# Patient Record
Sex: Female | Born: 1950 | Race: White | Hispanic: No | Marital: Married | Smoking: Former smoker
Health system: Southern US, Academic
[De-identification: ages and names within clinical notes are randomized; demographics above are authoritative.]

## PROBLEM LIST (undated history)

## (undated) DIAGNOSIS — J449 Chronic obstructive pulmonary disease, unspecified: Secondary | ICD-10-CM

## (undated) DIAGNOSIS — E039 Hypothyroidism, unspecified: Secondary | ICD-10-CM

## (undated) DIAGNOSIS — F319 Bipolar disorder, unspecified: Secondary | ICD-10-CM

## (undated) DIAGNOSIS — I10 Essential (primary) hypertension: Secondary | ICD-10-CM

## (undated) DIAGNOSIS — E785 Hyperlipidemia, unspecified: Secondary | ICD-10-CM

## (undated) DIAGNOSIS — M47816 Spondylosis without myelopathy or radiculopathy, lumbar region: Secondary | ICD-10-CM

## (undated) DIAGNOSIS — Z972 Presence of dental prosthetic device (complete) (partial): Secondary | ICD-10-CM

## (undated) DIAGNOSIS — F411 Generalized anxiety disorder: Secondary | ICD-10-CM

## (undated) DIAGNOSIS — E119 Type 2 diabetes mellitus without complications: Secondary | ICD-10-CM

## (undated) HISTORY — PX: HX BACK SURGERY: SHX140

## (undated) HISTORY — PX: HX GALL BLADDER SURGERY/CHOLE: SHX55

## (undated) HISTORY — PX: HX HYSTERECTOMY: SHX81

## (undated) HISTORY — DX: Complete loss of teeth, unspecified cause, unspecified class: Z97.2

## (undated) HISTORY — DX: Chronic obstructive pulmonary disease, unspecified (CMS HCC): J44.9

## (undated) HISTORY — DX: Presence of dental prosthetic device (complete) (partial): Z97.2

## (undated) HISTORY — DX: Spondylosis without myelopathy or radiculopathy, lumbar region: M47.816

## (undated) HISTORY — DX: Chronic obstructive pulmonary disease, unspecified: J44.9

---

## 2006-04-16 ENCOUNTER — Ambulatory Visit (HOSPITAL_COMMUNITY): Payer: Self-pay | Admitting: EXTERNAL

## 2010-10-13 HISTORY — PX: HX BACK SURGERY: SHX140

## 2022-12-26 ENCOUNTER — Inpatient Hospital Stay
Admission: RE | Admit: 2022-12-26 | Discharge: 2022-12-31 | DRG: 885 | Disposition: A | Discharge status: Home / Self Care | Payer: Medicare PPO | Attending: Psychiatry | Admitting: Psychiatry

## 2022-12-26 ENCOUNTER — Emergency Department (HOSPITAL_COMMUNITY): Payer: Medicare PPO

## 2022-12-26 ENCOUNTER — Encounter (HOSPITAL_COMMUNITY): Payer: Self-pay

## 2022-12-26 ENCOUNTER — Other Ambulatory Visit: Payer: Self-pay

## 2022-12-26 DIAGNOSIS — Z79890 Hormone replacement therapy (postmenopausal): Secondary | ICD-10-CM

## 2022-12-26 DIAGNOSIS — H409 Unspecified glaucoma: Secondary | ICD-10-CM | POA: Diagnosis present

## 2022-12-26 DIAGNOSIS — E039 Hypothyroidism, unspecified: Secondary | ICD-10-CM | POA: Diagnosis present

## 2022-12-26 DIAGNOSIS — Z7982 Long term (current) use of aspirin: Secondary | ICD-10-CM

## 2022-12-26 DIAGNOSIS — F17210 Nicotine dependence, cigarettes, uncomplicated: Secondary | ICD-10-CM | POA: Diagnosis present

## 2022-12-26 DIAGNOSIS — Z23 Encounter for immunization: Secondary | ICD-10-CM

## 2022-12-26 DIAGNOSIS — F411 Generalized anxiety disorder: Secondary | ICD-10-CM | POA: Diagnosis present

## 2022-12-26 DIAGNOSIS — Z79899 Other long term (current) drug therapy: Secondary | ICD-10-CM

## 2022-12-26 DIAGNOSIS — F32A Depression, unspecified: Principal | ICD-10-CM | POA: Diagnosis present

## 2022-12-26 DIAGNOSIS — E785 Hyperlipidemia, unspecified: Secondary | ICD-10-CM | POA: Diagnosis present

## 2022-12-26 DIAGNOSIS — F419 Anxiety disorder, unspecified: Secondary | ICD-10-CM

## 2022-12-26 DIAGNOSIS — Z811 Family history of alcohol abuse and dependence: Secondary | ICD-10-CM

## 2022-12-26 DIAGNOSIS — I10 Essential (primary) hypertension: Secondary | ICD-10-CM | POA: Diagnosis present

## 2022-12-26 DIAGNOSIS — T50901A Poisoning by unspecified drugs, medicaments and biological substances, accidental (unintentional), initial encounter: Secondary | ICD-10-CM

## 2022-12-26 DIAGNOSIS — Z639 Problem related to primary support group, unspecified: Secondary | ICD-10-CM

## 2022-12-26 DIAGNOSIS — Z1152 Encounter for screening for COVID-19: Secondary | ICD-10-CM

## 2022-12-26 DIAGNOSIS — E119 Type 2 diabetes mellitus without complications: Secondary | ICD-10-CM | POA: Diagnosis present

## 2022-12-26 DIAGNOSIS — F332 Major depressive disorder, recurrent severe without psychotic features: Principal | ICD-10-CM | POA: Diagnosis present

## 2022-12-26 DIAGNOSIS — Z7984 Long term (current) use of oral hypoglycemic drugs: Secondary | ICD-10-CM

## 2022-12-26 DIAGNOSIS — F319 Bipolar disorder, unspecified: Secondary | ICD-10-CM

## 2022-12-26 DIAGNOSIS — Z818 Family history of other mental and behavioral disorders: Secondary | ICD-10-CM

## 2022-12-26 DIAGNOSIS — F4320 Adjustment disorder, unspecified: Secondary | ICD-10-CM | POA: Diagnosis present

## 2022-12-26 DIAGNOSIS — R45851 Suicidal ideations: Secondary | ICD-10-CM | POA: Diagnosis present

## 2022-12-26 HISTORY — DX: Generalized anxiety disorder: F41.1

## 2022-12-26 HISTORY — DX: Hyperlipidemia, unspecified: E78.5

## 2022-12-26 HISTORY — DX: Type 2 diabetes mellitus without complications: E11.9

## 2022-12-26 HISTORY — DX: Essential (primary) hypertension: I10

## 2022-12-26 HISTORY — DX: Bipolar disorder, unspecified: F31.9

## 2022-12-26 HISTORY — DX: Bipolar disorder, unspecified (CMS HCC): F31.9

## 2022-12-26 HISTORY — DX: Hypothyroidism, unspecified: E03.9

## 2022-12-26 HISTORY — DX: Type 2 diabetes mellitus without complications (CMS HCC): E11.9

## 2022-12-26 LAB — COMPREHENSIVE METABOLIC PANEL, NON-FASTING
ALBUMIN: 3.2 g/dL — ABNORMAL LOW (ref 3.4–4.8)
ALKALINE PHOSPHATASE: 49 U/L — ABNORMAL LOW (ref 55–145)
ALT (SGPT): 11 U/L (ref 8–22)
ANION GAP: 9 mmol/L (ref 4–13)
AST (SGOT): 18 U/L (ref 8–45)
BILIRUBIN TOTAL: 0.3 mg/dL (ref 0.3–1.3)
BUN/CREA RATIO: 11 (ref 6–22)
BUN: 10 mg/dL (ref 8–25)
CALCIUM: 9.7 mg/dL (ref 8.6–10.3)
CHLORIDE: 107 mmol/L (ref 96–111)
CO2 TOTAL: 24 mmol/L (ref 23–31)
CREATININE: 0.91 mg/dL (ref 0.60–1.05)
ESTIMATED GFR - FEMALE: 68 mL/min/BSA (ref >=60)
GLUCOSE: 123 mg/dL (ref 65–125)
POTASSIUM: 3.7 mmol/L (ref 3.5–5.1)
PROTEIN TOTAL: 7.3 g/dL (ref 6.0–8.0)
SODIUM: 140 mmol/L (ref 136–145)

## 2022-12-26 LAB — CBC WITH DIFF
BASOPHIL #: 0.1 10*3/uL (ref 0.00–0.30)
BASOPHIL %: 1 % (ref 0–1)
EOSINOPHIL #: 0.1 10*3/uL (ref 0.00–0.50)
EOSINOPHIL %: 1 % (ref 0–4)
HCT: 38.6 % (ref 37.0–47.0)
HGB: 13.3 g/dL (ref 12.5–16.0)
LYMPHOCYTE #: 3.8 10*3/uL (ref 0.90–4.80)
LYMPHOCYTE %: 42 % — ABNORMAL HIGH (ref 23–35)
MCH: 28.5 pg (ref 28.0–33.0)
MCHC: 34.4 g/dL (ref 32.0–37.0)
MCV: 82.9 fL (ref 78.0–100.0)
MONOCYTE #: 0.9 10*3/uL (ref 0.30–0.90)
MONOCYTE %: 10 % (ref 0–12)
NEUTROPHIL #: 4 10*3/uL (ref 1.70–7.00)
NEUTROPHIL %: 45 % — ABNORMAL LOW (ref 50–70)
PLATELETS: 236 10*3/uL (ref 130–400)
RBC: 4.66 10*6/uL (ref 4.20–5.40)
RDW: 16.1 % (ref 9.9–16.5)
WBC: 9 10*3/uL (ref 4.5–11.0)

## 2022-12-26 LAB — DRUG SCREEN, NO CONFIRMATION, URINE
AMPHETAMINES, URINE: NEGATIVE
BARBITURATES URINE: NEGATIVE
BENZODIAZEPINES URINE: POSITIVE — AB
BUPRENORPHINE URINE: NEGATIVE
CANNABINOIDS URINE: NEGATIVE
COCAINE METABOLITES URINE: NEGATIVE
CREATININE RANDOM URINE: 63 mg/dL (ref 50–100)
ECSTASY/MDMA URINE: NEGATIVE
FENTANYL, RANDOM URINE: NEGATIVE
METHADONE URINE: NEGATIVE
OPIATES URINE (LOW CUTOFF): NEGATIVE
OXYCODONE URINE: NEGATIVE

## 2022-12-26 LAB — ACETAMINOPHEN LEVEL: ACETAMINOPHEN LEVEL: <5 ug/mL — ABNORMAL LOW (ref 10–30)

## 2022-12-26 LAB — COVID-19, FLU A/B, RSV RAPID BY PCR
INFLUENZA VIRUS TYPE A: NOT DETECTED
INFLUENZA VIRUS TYPE B: NOT DETECTED
RESPIRATORY SYNCTIAL VIRUS (RSV): NOT DETECTED
SARS-CoV-2: NOT DETECTED

## 2022-12-26 LAB — SALICYLATE ACID LEVEL: SALICYLATE LEVEL: <5 mg/dL — ABNORMAL LOW (ref 15–30)

## 2022-12-26 LAB — PT/INR
INR: 1.13 (ref 0.83–1.16)
PROTHROMBIN TIME: 13 seconds (ref 9.5–13.4)

## 2022-12-26 MED ORDER — FLU VACC 2023-24(65YR UP)-MF59C(PF) 60 MCG(15 MCGX4)/0.5 ML IM SYRINGE
0.5000 mL | INJECTION | INTRAMUSCULAR | Status: DC
Start: 2022-12-26 — End: 2022-12-29

## 2022-12-26 NOTE — ED Nurses Note (Signed)
security continues on watch

## 2022-12-26 NOTE — ED Provider Notes (Signed)
[name]  Aug 02, 1951  72 y.o.  female    Chief Complaint:   Chief Complaint   Patient presents with    Suicide Attempt    Drug Overdose Intentional   Reviewed    HPI: This is a 72 y.o. female who presents to the emergency department patient has been under treatment for suicide attempts in the past and has been on medications and shins has taken herself off them because side-effects and not able to tolerate them she was on gabapentin and Prozac they did not homes seemed to help her homes they caused her stomach upset so she stopped them homes she did wean herself with the Prozac down to 20 mg homes today he and she was feeling that she was just could not take it anymore feeling extremely depressed that she does not do anything right and according to her husband and others homes so she took 6 Xanax 1 mg tablets to 25 mg tablets some Benadryl and Norco homes thinking that that would probably did make her go to sleep and not wake up of hurt when she woke up she was A's A's she was holding a knife threatening to stab herself at her residence homes family brought her got a come here to the hospital for evaluation patient denies other complaints she was A's she does have history of chronic obstructive pulmonary disease diabetes and hypertension eyes homes extreme depression daughter states he has a history of bipolar disorder.  Patient did try and contact her psychiatrist to get an appointment and counselor but no one was able to see her and when she felt that there was no hope that has is no wanted to deal with her hers she decided to do what she did she was upset that did not work but now is she is here ears willing to get help and treatment          Past Medical History:   Past Medical History:   Diagnosis Date    Diabetes mellitus, type 2 (CMS HCC)     HTN (hypertension)    Bipolar disorder depression COPD    Past Surgical History:   Past Surgical History:   Procedure Laterality Date    Hx back surgery      Hx  cesarean section      Hx cholecystectomy      Hx hysterectomy     Reviewed    Social History:   Social History     Tobacco Use    Smoking status: Every Day     Types: Cigarettes     Passive exposure: Current    Smokeless tobacco: Never      Social History     Substance and Sexual Activity   Drug Use Not on file   Patient states he was stops smoking but now is started smoking again because of her depression    Allergies: No Known Allergies reviewed      Review of Systems: all systems reviewed and negative except as noted in HPI and perinent postivie and negatives in history of present illnes  Physical Exam    General:  Patient alert and oriented x4.  No acute distress.  BP (!) 196/86   Pulse 76   Temp 36.7 C (98.1 F)   Resp 17   Ht 1.626 m (5\' 4" )   Wt 90.7 kg (200 lb)   SpO2 97%   BMI 34.33 kg/m         HEENT:  Head:  Normocephalic, atraumatic.  Eyes: Normal conjunctiva.  Pupils round reactive light sclera clear ears members intact erythema nose is clear without discharge present throat clear without any X Oropharynx: Oral mucosa is moist.     Neck: Supple.  No lymphadenopathy.  Carotid pulses ,  thyroid ,  no tenderness,  range of motion good,      Respiratory:  Chest clear to auscultation bilaterally. No rales, rhonchi, or wheezes.  No respiratory distress.     Cardiovascular:  Heart regular rate and rhythm without murmurs, rubs, or gallops.      Abdomen: Soft, non-tender, bowel sounds positive no organomegally.      Extremities:  Normal pulses. No clubbing, cyanosis, or edema.  Good range of motion good tone good strength pulses are symmetrical    MS:  No ROM deficit.  Normal strength.     Neuro: Alert and oriented x4.  No focal motor or sensory deficits. [place] coma scale 15, gait ,  no ataxia,  cranial nerves 2 through 12 intact,   deep tendon 2 over4 strength 5/5 emotional crying at times    Skin:  Warm and dry. No rashes or lesions.  No petechiae        Medical Decision Making:   Patient was  triaged, vital signs were obtained, patient was  placed in a room.  I did examine the patient.  After examining the patient did time patient is probably passed the window of toxicity from homes Xanax Norco or Benadryl that she took 6 hours ago will speak with the poison control and check laboratories EKGs and placed her on a monitor to make sure he was no signs of toxicity or laboratory abnormalities does time physical examination shows her to be extremely depressed with no homes other significant symptoms blood pressure is elevated  Results for orders placed or performed during the hospital encounter of 12/26/22 (from the past 12 hour(s))   ACETAMINOPHEN LEVEL   Result Value Ref Range    ACETAMINOPHEN LEVEL  <5 (L) 10 - 30 ug/mL    COMPREHENSIVE METABOLIC PANEL, NON-FASTING   Result Value Ref Range    SODIUM 140 136 - 145 mmol/L    POTASSIUM 3.7 3.5 - 5.1 mmol/L    CHLORIDE 107 96 - 111 mmol/L    CO2 TOTAL 24 23 - 31 mmol/L    ANION GAP 9 4 - 13 mmol/L    BUN 10 8 - 25 mg/dL    CREATININE 0.91 0.60 - 1.05 mg/dL    BUN/CREA RATIO 11 6 - 22    ALBUMIN 3.2 (L) 3.4 - 4.8 g/dL     CALCIUM 9.7 8.6 - 10.3 mg/dL    GLUCOSE 123 65 - 125 mg/dL    ALKALINE PHOSPHATASE 49 (L) 55 - 145 U/L    ALT (SGPT) 11 8 - 22 U/L    AST (SGOT)  18 8 - 45 U/L    BILIRUBIN TOTAL 0.3 0.3 - 1.3 mg/dL    PROTEIN TOTAL 7.3 6.0 - 8.0 g/dL    ESTIMATED GFR - FEMALE 68 [id number] mL/min/BSA   SALICYLATE ACID LEVEL   Result Value Ref Range    SALICYLATE LEVEL <5 (L) 15 - 30 mg/dL   PT/INR   Result Value Ref Range    PROTHROMBIN TIME 13.0 9.5 - 13.4 seconds    INR 1.13 0.83 - 1.16   COVID-19, FLU A/B, RSV RAPID BY PCR   Result Value Ref Range    SARS-CoV-2 Not  Detected Not Detected    INFLUENZA VIRUS TYPE A Not Detected Not Detected    INFLUENZA VIRUS TYPE B Not Detected Not Detected    RESPIRATORY SYNCTIAL VIRUS (RSV) Not Detected Not Detected   CBC WITH DIFF   Result Value Ref Range    WBC 9.0 4.5 - 11.0 x10^3/uL    RBC 4.66 4.20 - 5.40 x10^6/uL    HGB  13.3 12.5 - 16.0 g/dL    HCT 38.6 37.0 - 47.0 %    MCV 82.9 78.0 - 100.0 fL    MCH 28.5 28.0 - 33.0 pg    MCHC 34.4 32.0 - 37.0 g/dL    RDW 16.1 9.9 - 16.5 %    PLATELETS 236 130 - 400 x10^3/uL    NEUTROPHIL % 45 (L) 50 - 70 %    LYMPHOCYTE % 42 (H) 23 - 35 %    MONOCYTE % 10 0 - 12 %    EOSINOPHIL % 1 0 - 4 %    BASOPHIL % 1 0 - 1 %    NEUTROPHIL # 4.00 1.70 - 7.00 x10^3/uL    LYMPHOCYTE # 3.80 0.90 - 4.80 x10^3/uL    MONOCYTE # 0.90 0.30 - 0.90 x10^3/uL    EOSINOPHIL # 0.10 0.00 - 0.50 x10^3/uL    BASOPHIL # 0.10 0.00 - 0.30 x10^3/uL            SAFE-T Protocol with C-SSRS ([place] Risk and Protective Factors) - Recent    Step 1: Identify Risk Factors                                                   C-SSRS Suicidal Ideation Severity Within one Month   Wish to be dead  Have you wished you were dead or wished you could go to sleep and not wake up?  Yes / No   Current suicidal thoughts  Have you actually had any thoughts of killing yourself?   Yes / No   Suicidal thoughts w/ Method (w/no specific Plan or Intent or act)  Have you been thinking about how you might do this? Yes / No   Suicidal Intent without Specific Plan  Have you had these thoughts and had some intention of acting on them? Yes / No   Intent with Plan  Have you started to work out or worked out the details of how to kill yourself? Do you intend to carry out this plan? Yes / No   C-SSRS Suicidal Behavior: "Have you ever done anything, started to do anything, or prepared to do anything to end your life?"    Examples: Collected pills, obtained a gun, gave away valuables, wrote a will or suicide note, took out pills but didn't swallow any, held a gun but changed your mind or it was grabbed from your hand, went to the roof but didn't jump; or actually took pills, tried to shoot yourself, cut yourself, tried to hang yourself, etc.  If "YES" Was it within the past 3 months? Lifetime    Yes / No    Past 3 Months    Yes / No   Activating Events may include:      Recent losses or other significant negative event(s) (legal, financial, relationship, etc.),   Pending incarceration or homelessness , Current or pending isolation or feeling alone  Treatment History may include:     Previous psychiatric diagnosis and treatments, Hopeless or dissatisfied with treatment , and Non-compliant with treatment      Clinical Status may include:     Hopelessness , Major Depressive Episode, Mixed affect episode (e.g. Bipolar), Command Hallucinations to hurt self, Chronic physical pain or other acute medical problem (e.g. CNS disorders), Highly Impulsive Behavior, Substance abuse or dependence, Agitation or severe anxiety, Perceived burden on family or others, Homicidal Ideation, Aggressive behavior towards others, Refuses or feels unable to agree to safety plan, Sexual abuse (lifetime), Family history of suicide, None       Access to lethal methods: Ask specifically about presence or absence of a firearm in the home or ease of accessing:    Yes / No     Step 2: Identify Protective Factors (Protective factors may not counteract significant acute suicide risk factors)   Internal:     Fear of death or dying due to pain and suffering and Identifies reasons for living External:     {Belief that suicide is immoral; high spirituality, Responsibility to family or others; living with family, Supportive social network of family or friends, and Engaged in work or school     Step 3: Specific questioning about Thoughts, Plans, and Suicidal Intent - (see Step 1 for Ideation Severity and Behavior)   C-SSRS Suicidal Ideation Intensity (with respect to the most severe ideation 1-5 identified above) Month   Frequency  How many times have you had these thoughts?  1: Less than once a week  2: Once per week  3: 2-5 times per week  4: Daily or almost daily  5: Many times each day   Duration  When you have the thoughts how long do they last? 1: Fleeting, less than a few seconds  2: Less than 1 hour/some of  the time  3: 1-4 hours/ a lot of the time  4: 4-8 hours/most of the day  5: More than 8 hours or persistent/continuous   Controllability  Could/can you stop thinking about killing yourself or wanting to die if you want to? 1: Easily able to control thoughts  2: Can control thoughts with little difficulty  3: Can control thoughts with some difficulty  4: Can control thoughts with a lot of difficulty  5: Unable to control thoughts  6: Does not attempt to control thoughts   Deterrents  Are there things - anyone or anything (e.g., family, religion, pain of death) - that stopped you from wanting to die or acting on thoughts of suicide? 1: Deterrents definitely stopped you from committing suicide  2: Deterrents probably stopped you  3: Uncertain that deterrents stopped you  4: Deterrents most likely did not stop you  5: Deterrents definitely did not stop you  0: Does not apply    Reasons for Ideation  What sort of reasons did you have for thinking about wanting to die or killing yourself?  Was it to end the pain or stop the way you were feeling (in other words you couldn't go on living with this pain or how you were feeling) or was it to get attention, revenge or a reaction from others? Or both? 1: Completely to get attention, revenge, or a reaction from others  2: Mostly to get attention, revenge, or a reaction from others  3: Equally to get attention, revenge, or a reaction from others and to stop the pain  4: mostly to end or stop the pain (  you couldn't go on living with the pain or how you were feeling)  5: completely to end or stop the pain (you couldn't go on living with the pain or how you were feeling)  0: Does not apply                               Step 4: Guidelines to Determine Level of Risk and Develop Interventions to LOWER Risk Level  "The estimation of suicide risk, at the culmination of the suicide assessment, is the quintessential clinical judgment, since no study has identified one specific risk factor  or set of risk factors as specifically predictive of suicide or other suicidal behavior."   From The American Psychiatric Association Practice Guidelines for the Assessment and Treatment of Patients with Suicidal Behaviors, page 24.   RISK STRATIFICATION TRIAGE   High Suicide Risk  Suicidal ideation with intent or intent with plan in past month (C-SSRS Suicidal Ideation #4 or #5)  Or  Suicidal behavior within past 3 months (C-SSRS Suicidal Behavior)   Initiate local psychiatric admission process  Stay with patient until transfer to higher level of care is complete  Follow-up and document outcome of emergency psychiatric evaluation     Moderate Suicide Risk  Suicidal ideation with method, WITHOUT plan, intent or behavior       in past month (C-SSRS Suicidal Ideation #3)  Or  Suicidal behavior more than 3 months ago (C-SSRS Suicidal Behavior Lifetime)  Or  Multiple risk factors and few protective factors Directly address suicide risk, implementing suicide prevention strategies  Develop Safety Plan     Low Suicide Risk  Wish to die or Suicidal Ideation WITHOUT method, intent, plan or behavior (C-SSRS Suicidal Ideation #1 or #2)   Or  Modifiable risk factors and strong protective factors  Or  No reported history of Suicidal Ideation or Behavior  Discretionary Outpatient Referral     Step 5: Documentation   Risk Level :    Based on the above scoring system and provider clinical judgment the patient is deemed severe suicide risk.          EKGs is sinus rhythm heart rate of 83 axis 29 no ST segment elevations or abnormalities present interpreted by me at 7:36 p.m.    Chest x-ray shows basilar atelectasis in his chronic lung changes reviewed by me pending Radiology report at 7:45 a.m.  Patient was medically stable hers no toxicity feel patient can be admitted to the psych unit mental health unit for behavioral health unit for further treatment care homes discussed with Dr. Higinio Plan he was going to admit her for depression  and suicide   This note was partially created using voice recognition software and is inherently subject to errors including those of syntax and "sound alike " substitutions which may escape proof reading.  In such instances, original meaning may be extrapolated by contextual derivation.         Clinical Impression:   Clinical Impression   Depression with suicidal ideation (Primary)   Intentional ingestion nontoxic  Medically stable for admission to [place] unit        Disposition: Admitted      Timmothy Euler, DO           Procedures

## 2022-12-26 NOTE — ED Nurses Note (Signed)
Called Security for patient watch 1:1

## 2022-12-26 NOTE — ED Nurses Note (Signed)
report called to [facility] RN [facility]

## 2022-12-26 NOTE — Care Plan (Signed)
The patient was cooperative with the admission process.  She was depressed and occasionally tearful.  She complained of feeling tired and wanting to go to bed.  She complained of chronic back pain which hampered her ambulation.    She denies HI and hallucinations.  She states that she has no impulse or urge to harm herself at this time.  She agrees to approach staff if she has suicidal ideations.    The patient went to bed and appeared to sleep through the night.      Problem: Adult Behavioral Health Plan of Care  Goal: Plan of Care Review  Outcome: Ongoing (see interventions/notes)

## 2022-12-26 NOTE — ED Nurses Note (Signed)
security continues as watch, daughter at bedside

## 2022-12-26 NOTE — Nurses Notes (Signed)
72 year old female admitted on a voluntary commitment through [facility]' ER.  "I don't want to live anymore, I just want to die."  The patient admits to intentionally overdosing on her medications in an attempt to end her life.  "I'm [age].  Life is over."  The patient took six mg xanax and 50 mg benadryl.  "I went to sleep."  "Then I woke up." "I failed."  "So I took a vicodan."  "I was going to take more, but I called my daughter instead."       She patient describes being "sad" for a very long time.  She complained of severe stress secondary to her recent move from [place].  "I told him I did not want to move."  She currently lives with her husband, son and grandchild in a house in [place].  She has been married for thirty-seven years.  She says her marriage is without affection.      The patient has had four previous psychiatric hospitalizations in [place], the most recent was five years ago. She is currently prescribed Prozac (but may not be taking it.)  "It helped in the beginning, but then it stopped and my doctor kept increasing it and he wouldn't listen to me."  She is also prescribed xanax.  She says she has been taking xanax for over twenty years.  She reports that she has increased her xanax consumption recently.  "I've been feeling more anxious."    The patient has chronic back pain. She denies hallucinations.  She denies alcohol and illicit drug use.  She denies homicidal ideations.  She says she is "too tired to be suicidal" at this time.  She agrees to approach staff if she has any urge or impulse to harm herself.

## 2022-12-26 NOTE — ED Triage Notes (Addendum)
Patient presents to ED via [facility] for intentional drug overdose- suicide attempt. Patient states, "I don't want to live anymore, I just want to die." Patient states that at approximately 1100 she took six xanax- 6mg  total and two 25 mg benadryl. Patient reports taking 1 Norco. Daughter present in triage booth- states that when she entered patient's residence, patient was holding knife threatening to stab herself.

## 2022-12-26 NOTE — ED Nurses Note (Addendum)
Patient had large bag of prescription and OTC medications. Removed from possession upon entering ED 6 and entered into record. Patient upset when removing medications from possession stating, "I want to die, I want to die." Patient has multiple full/nearly full bottles of Prozac. Daughter states that patient has been noncompliant with medication regimen. Medications labeled with patient label and placed in secured belongings bin.

## 2022-12-26 NOTE — ED Nurses Note (Signed)
pt encouraged for urine sample, states that she can't at this time, but she could try if she had some water, was given a drink

## 2022-12-26 NOTE — ED Nurses Note (Signed)
security watch initiated for pt, daughter at bedside, pt was changed into hospital clothing, belongings secured outside of room

## 2022-12-27 ENCOUNTER — Encounter (HOSPITAL_COMMUNITY): Payer: Self-pay

## 2022-12-27 ENCOUNTER — Encounter (HOSPITAL_COMMUNITY): Payer: Self-pay | Admitting: PSYCHIATRY

## 2022-12-27 DIAGNOSIS — F17210 Nicotine dependence, cigarettes, uncomplicated: Secondary | ICD-10-CM

## 2022-12-27 DIAGNOSIS — F4323 Adjustment disorder with mixed anxiety and depressed mood: Secondary | ICD-10-CM

## 2022-12-27 DIAGNOSIS — E785 Hyperlipidemia, unspecified: Secondary | ICD-10-CM

## 2022-12-27 DIAGNOSIS — T424X2A Poisoning by benzodiazepines, intentional self-harm, initial encounter: Secondary | ICD-10-CM

## 2022-12-27 DIAGNOSIS — F418 Other specified anxiety disorders: Secondary | ICD-10-CM

## 2022-12-27 DIAGNOSIS — E039 Hypothyroidism, unspecified: Secondary | ICD-10-CM

## 2022-12-27 DIAGNOSIS — E119 Type 2 diabetes mellitus without complications: Secondary | ICD-10-CM

## 2022-12-27 DIAGNOSIS — I10 Essential (primary) hypertension: Secondary | ICD-10-CM

## 2022-12-27 DIAGNOSIS — R9431 Abnormal electrocardiogram [ECG] [EKG]: Secondary | ICD-10-CM

## 2022-12-27 DIAGNOSIS — F319 Bipolar disorder, unspecified: Secondary | ICD-10-CM

## 2022-12-27 DIAGNOSIS — F419 Anxiety disorder, unspecified: Secondary | ICD-10-CM

## 2022-12-27 DIAGNOSIS — F339 Major depressive disorder, recurrent, unspecified: Secondary | ICD-10-CM

## 2022-12-27 DIAGNOSIS — F411 Generalized anxiety disorder: Secondary | ICD-10-CM

## 2022-12-27 DIAGNOSIS — H409 Unspecified glaucoma: Secondary | ICD-10-CM

## 2022-12-27 DIAGNOSIS — T450X2A Poisoning by antiallergic and antiemetic drugs, intentional self-harm, initial encounter: Secondary | ICD-10-CM

## 2022-12-27 LAB — ECG 12-LEAD
Atrial Rate: 83 {beats}/min
Calculated P Axis: 71 degrees
Calculated R Axis: 29 degrees
Calculated T Axis: 39 degrees
PR Interval: 138 ms
QRS Duration: 78 ms
QT Interval: 386 ms
QTC Calculation: 453 ms
Ventricular rate: 83 {beats}/min

## 2022-12-27 LAB — POC BLOOD GLUCOSE (RESULTS): GLUCOSE, POC: 96 mg/dl (ref 60–110)

## 2022-12-27 MED ORDER — GLIPIZIDE 5 MG TABLET
2.5000 mg | ORAL_TABLET | Freq: Two times a day (BID) | ORAL | Status: DC
Start: 2022-12-27 — End: 2022-12-31
  Administered 2022-12-27 – 2022-12-31 (×8): 2.5 mg via ORAL
  Filled 2022-12-27 (×8): qty 1

## 2022-12-27 MED ORDER — ROSUVASTATIN 20 MG TABLET
20.0000 mg | ORAL_TABLET | Freq: Every evening | ORAL | Status: DC
Start: 2022-12-27 — End: 2022-12-31
  Administered 2022-12-27 – 2022-12-30 (×4): 20 mg via ORAL
  Filled 2022-12-27 (×4): qty 1

## 2022-12-27 MED ORDER — FLUOXETINE 20 MG CAPSULE
20.0000 mg | ORAL_CAPSULE | Freq: Every day | ORAL | Status: DC
Start: 2022-12-28 — End: 2022-12-28
  Administered 2022-12-28: 20 mg via ORAL
  Filled 2022-12-27: qty 1

## 2022-12-27 MED ORDER — ASPIRIN 81 MG TABLET,DELAYED RELEASE
81.0000 mg | DELAYED_RELEASE_TABLET | Freq: Every day | ORAL | Status: DC
Start: 2022-12-27 — End: 2022-12-31
  Administered 2022-12-27 – 2022-12-31 (×5): 81 mg via ORAL
  Filled 2022-12-27 (×5): qty 1

## 2022-12-27 MED ORDER — LISINOPRIL 2.5 MG TABLET
2.5000 mg | ORAL_TABLET | Freq: Every day | ORAL | Status: DC
Start: 2022-12-27 — End: 2022-12-31
  Administered 2022-12-27 – 2022-12-31 (×5): 2.5 mg via ORAL
  Filled 2022-12-27 (×5): qty 1

## 2022-12-27 MED ORDER — DOCUSATE SODIUM 100 MG CAPSULE
100.0000 mg | ORAL_CAPSULE | Freq: Two times a day (BID) | ORAL | Status: DC | PRN
Start: 2022-12-27 — End: 2022-12-31

## 2022-12-27 MED ORDER — LATANOPROST 0.005 % EYE DROPS
1.0000 [drp] | Freq: Every evening | OPHTHALMIC | Status: DC
Start: 2022-12-27 — End: 2022-12-31
  Administered 2022-12-27 – 2022-12-30 (×4): 1 [drp] via OPHTHALMIC
  Filled 2022-12-27: qty 2.5

## 2022-12-27 MED ORDER — DIPHENHYDRAMINE 25 MG CAPSULE
25.0000 mg | ORAL_CAPSULE | Freq: Four times a day (QID) | ORAL | Status: DC | PRN
Start: 2022-12-27 — End: 2022-12-31
  Administered 2022-12-27: 25 mg via ORAL

## 2022-12-27 MED ORDER — ALUMINUM-MAG HYDROXIDE-SIMETHICONE 200 MG-200 MG-20 MG/5 ML ORAL SUSP
5.0000 mL | ORAL | Status: DC | PRN
Start: 2022-12-27 — End: 2022-12-31
  Administered 2022-12-30: 5 mL via ORAL
  Filled 2022-12-27: qty 30

## 2022-12-27 MED ORDER — IBUPROFEN 400 MG TABLET
400.0000 mg | ORAL_TABLET | Freq: Four times a day (QID) | ORAL | Status: DC | PRN
Start: 2022-12-27 — End: 2022-12-31

## 2022-12-27 MED ORDER — DIPHENHYDRAMINE 25 MG CAPSULE
25.0000 mg | ORAL_CAPSULE | Freq: Every evening | ORAL | Status: DC | PRN
Start: 2022-12-27 — End: 2022-12-31
  Administered 2022-12-27: 0 mg via ORAL
  Administered 2022-12-28 – 2022-12-30 (×3): 25 mg via ORAL
  Filled 2022-12-27 (×4): qty 1

## 2022-12-27 MED ORDER — PROMETHAZINE 25 MG TABLET
25.0000 mg | ORAL_TABLET | Freq: Four times a day (QID) | ORAL | Status: DC | PRN
Start: 2022-12-27 — End: 2022-12-31

## 2022-12-27 MED ORDER — ALPRAZOLAM 0.5 MG TABLET
0.5000 mg | ORAL_TABLET | Freq: Three times a day (TID) | ORAL | Status: DC | PRN
Start: 2022-12-27 — End: 2022-12-31
  Administered 2022-12-27 – 2022-12-30 (×8): 0.5 mg via ORAL
  Filled 2022-12-27 (×8): qty 1

## 2022-12-27 MED ORDER — ACETAMINOPHEN 325 MG TABLET
650.0000 mg | ORAL_TABLET | Freq: Four times a day (QID) | ORAL | Status: DC | PRN
Start: 2022-12-27 — End: 2022-12-31

## 2022-12-27 MED ORDER — MULTIVITAMIN WITH FOLIC ACID 400 MCG TABLET
1.0000 | ORAL_TABLET | Freq: Every day | ORAL | Status: DC
Start: 2022-12-27 — End: 2022-12-31
  Administered 2022-12-27 – 2022-12-31 (×5): 1 via ORAL
  Filled 2022-12-27 (×5): qty 1

## 2022-12-27 MED ORDER — LEVOTHYROXINE 25 MCG TABLET
25.0000 ug | ORAL_TABLET | Freq: Every morning | ORAL | Status: DC
Start: 2022-12-27 — End: 2022-12-31
  Administered 2022-12-27 – 2022-12-31 (×5): 25 ug via ORAL
  Filled 2022-12-27 (×5): qty 1

## 2022-12-27 MED ORDER — FLUOXETINE 20 MG CAPSULE
80.0000 mg | ORAL_CAPSULE | Freq: Every day | ORAL | Status: DC
Start: 2022-12-27 — End: 2022-12-27
  Administered 2022-12-27: 80 mg via ORAL
  Filled 2022-12-27: qty 4

## 2022-12-27 NOTE — Behavioral Health (Signed)
Pt is resting in bed. She slept all night

## 2022-12-27 NOTE — H&P (Signed)
Internal Medicine  Hospitalist Admission H&P        Date of Service: 12/27/2022  [name], [name], 72 y.o. female  Date of Admission:  12/26/2022  Date of Birth:  1951-07-10  PCP: No Pcp    Information Obtained from: Patient      Chief Complaint: Suicidal ideation and attempt      HPI: [name] is a 72 y.o. female who admitted to the psychiatric unit after suicide attempt by intentional drug overdose.  She reports taking about 5-6 mg of Xanax and two Benadryl tablets in a suicide attempt.  She also was apparently threatening to stab herself with a knife.  Today, she feels very shaky and unsteady on her feet.  She is hungry, but otherwise has no acute complaint.      Past medical history:       Past Medical History:   Diagnosis Date    Bipolar disorder (CMS HCC)     Diabetes mellitus, type 2 (CMS HCC)     Generalized anxiety disorder     HLD (hyperlipidemia)     HTN (hypertension)     Hypothyroidism        Past surgical history:     Past Surgical History:   Procedure Laterality Date    HX BACK SURGERY      HX CESAREAN SECTION      HX CHOLECYSTECTOMY      HX HYSTERECTOMY         Medication prior to admission     Medications Prior to Admission       Prescriptions    ALPRAZolam (XANAX) 0.5 mg Oral Tablet    Take 1 Tablet (0.5 mg total) by mouth Three times a day as needed for Insomnia or Anxiety    aspirin (ECOTRIN) 81 mg Oral Tablet, Delayed Release (E.C.)    Take 1 Tablet (81 mg total) by mouth Once a day    calcium carbonate/vitamin D3 (CALTRATE 600 + D ORAL)    Take by mouth    FLUoxetine (PROZAC) 20 mg Oral Capsule    Take 4 Capsules (80 mg total) by mouth Once a day    gabapentin (NEURONTIN) 300 mg Oral Capsule    Take 3 Capsules (900 mg total) by mouth Three times a day    glipiZIDE 2.5 mg Oral Tablet    Take 1 Tablet (2.5 mg total) by mouth Twice a day before meals Take 30 minutes before meals    HYDROcodone-acetaminophen (NORCO) 5-325 mg Oral Tablet    Take 1 Tablet by mouth Every 4 hours as  needed for Pain    latanoprost (XALATAN) 0.005 % Ophthalmic Drops    Instill 1 Drop into both eyes Every evening    levothyroxine (SYNTHROID) 25 mcg Oral Tablet    Take 1 Tablet (25 mcg total) by mouth Every morning    lisinopriL (PRINIVIL) 2.5 mg Oral Tablet    Take 1 Tablet (2.5 mg total) by mouth Once a day    multivitamin with folic acid (THERA) [id number] mcg Oral Tablet    Take 1 Tablet by mouth Once a day    rosuvastatin (CRESTOR) 20 mg Oral Tablet    Take 1 Tablet (20 mg total) by mouth Every evening    traMADoL (ULTRAM) 50 mg Oral Tablet    Take by mouth Every 6 hours as needed for Pain             Allergies     No Known Allergies  Family History    Family Medical History:    None         Social History     reports that she has been smoking cigarettes. She has been exposed to tobacco smoke. She has never used smokeless tobacco. She reports that she does not currently use alcohol. She reports that she does not use drugs.      ROS:   General: Patient denies fever or chills.  Reports tremors all over and weakness and unsteadiness on her feet.  Eyes: Patient denies visual changes.  HEENT: Patient denies headache, tinnitus, nasal congestion, or sore throat.  Cardiac: Patient denies chest pain or palpitations.  Respiratory: Patient denies shortness of breath or wheezing.  Denies cough.  GI: Patient denies abdominal pain, nausea, vomiting, diarrhea, hematemesis, hematochezia, or melena.  GU: Patient denies any pain, blood or burning with urination.  Musculoskeletal: Patient denies joint pain.  Neurological: Patient denies dizziness, lightheadedness, numbness, tingling or loss of sensation in any extremity.      Vital signs:    Temperature: 36.3 C (97.3 F) Heart Rate: 83 BP (Non-Invasive): 128/78   Respiratory Rate: 17 SpO2: 98 %       I personally reviewed all vitals    Physical Exam:  GENERAL: This is a 72 y.o. year old female in no acute distress, she is sitting on the side of the bed  EYES: Sclera  anicteric  HEENT: Head atraumatic, normocephalic, oral mucosa moist  HEART: Regular rate and rhythm, no murmurs  LUNGS: CTA bilaterally, no wheezes, rhonchi, or rales  ABDOMEN: Soft, nontender, positive bowel sounds  EXTREMITY: No peripheral edema  MUSCULOSKELETAL: No deformity  NEURO: Alert and oriented x 3, CN II-XII grossly intact, moves all extremities, sensation all extremities, resting tremors of head and hands    Labs:    Results for orders placed or performed during the hospital encounter of 12/26/22 (from the past 24 hour(s))   ACETAMINOPHEN LEVEL   Result Value Ref Range    ACETAMINOPHEN LEVEL  <5 (L) 10 - 30 ug/mL    COMPREHENSIVE METABOLIC PANEL, NON-FASTING   Result Value Ref Range    SODIUM 140 136 - 145 mmol/L    POTASSIUM 3.7 3.5 - 5.1 mmol/L    CHLORIDE 107 96 - 111 mmol/L    CO2 TOTAL 24 23 - 31 mmol/L    ANION GAP 9 4 - 13 mmol/L    BUN 10 8 - 25 mg/dL    CREATININE 0.91 0.60 - 1.05 mg/dL    BUN/CREA RATIO 11 6 - 22    ALBUMIN 3.2 (L) 3.4 - 4.8 g/dL     CALCIUM 9.7 8.6 - 10.3 mg/dL    GLUCOSE 123 65 - 125 mg/dL    ALKALINE PHOSPHATASE 49 (L) 55 - 145 U/L    ALT (SGPT) 11 8 - 22 U/L    AST (SGOT)  18 8 - 45 U/L    BILIRUBIN TOTAL 0.3 0.3 - 1.3 mg/dL    PROTEIN TOTAL 7.3 6.0 - 8.0 g/dL    ESTIMATED GFR - FEMALE 68 [id number] mL/min/BSA   SALICYLATE ACID LEVEL   Result Value Ref Range    SALICYLATE LEVEL <5 (L) 15 - 30 mg/dL   PT/INR   Result Value Ref Range    PROTHROMBIN TIME 13.0 9.5 - 13.4 seconds    INR 1.13 0.83 - 1.16   COVID-19, FLU A/B, RSV RAPID BY PCR   Result Value Ref Range    SARS-CoV-2  Not Detected Not Detected    INFLUENZA VIRUS TYPE A Not Detected Not Detected    INFLUENZA VIRUS TYPE B Not Detected Not Detected    RESPIRATORY SYNCTIAL VIRUS (RSV) Not Detected Not Detected   CBC WITH DIFF   Result Value Ref Range    WBC 9.0 4.5 - 11.0 x10^3/uL    RBC 4.66 4.20 - 5.40 x10^6/uL    HGB 13.3 12.5 - 16.0 g/dL    HCT 38.6 37.0 - 47.0 %    MCV 82.9 78.0 - 100.0 fL    MCH 28.5 28.0 - 33.0 pg     MCHC 34.4 32.0 - 37.0 g/dL    RDW 16.1 9.9 - 16.5 %    PLATELETS 236 130 - 400 x10^3/uL    NEUTROPHIL % 45 (L) 50 - 70 %    LYMPHOCYTE % 42 (H) 23 - 35 %    MONOCYTE % 10 0 - 12 %    EOSINOPHIL % 1 0 - 4 %    BASOPHIL % 1 0 - 1 %    NEUTROPHIL # 4.00 1.70 - 7.00 x10^3/uL    LYMPHOCYTE # 3.80 0.90 - 4.80 x10^3/uL    MONOCYTE # 0.90 0.30 - 0.90 x10^3/uL    EOSINOPHIL # 0.10 0.00 - 0.50 x10^3/uL    BASOPHIL # 0.10 0.00 - 0.30 x10^3/uL   ECG 12-LEAD   Result Value Ref Range    Ventricular rate 83 BPM    Atrial Rate 83 BPM    PR Interval 138 ms    QRS Duration 78 ms    QT Interval 386 ms    QTC Calculation 453 ms    Calculated P Axis 71 degrees    Calculated R Axis 29 degrees    Calculated T Axis 39 degrees   DRUG SCREEN, NO CONFIRMATION, URINE   Result Value Ref Range    AMPHETAMINES, URINE Negative Negative    BARBITURATES URINE Negative Negative    BENZODIAZEPINES URINE Positive (A) Negative    BUPRENORPHINE URINE Negative Negative    CANNABINOIDS URINE Negative Negative    COCAINE METABOLITES URINE Negative Negative    METHADONE URINE Negative Negative    OPIATES URINE (LOW CUTOFF) Negative Negative    OXYCODONE URINE Negative Negative    ECSTASY/MDMA URINE Negative Negative    FENTANYL, RANDOM URINE Negative Negative    CREATININE RANDOM URINE 63 50 - 100 mg/dL   POC BLOOD GLUCOSE (RESULTS)   Result Value Ref Range    GLUCOSE, POC 96 60 - 110 mg/dl       I personally reviewed all labs       Imaging Studies:   XR CHEST AP   Final Result   NO ACUTE FINDINGS.            Radiologist location ID: [id number]             I personally reviewed all imaging       DNR Status: Full Code      Problem List:     Active Hospital Problems    Diagnosis    Primary Problem: Depression with suicidal ideation    Anxiety    DM2 (diabetes mellitus, type 2) (CMS HCC)    Bipolar disorder (CMS HCC)    Hypothyroidism    HTN (hypertension)    HLD (hyperlipidemia)    Glaucoma       Assessment/Plan:      1. Depression with suicidal ideation and  attempt/Bipolar disorder/Anxiety - Plan  per the primary Psychiatric service.    2. DM2 - continue home glipizide. Diabetic diet.  Check fasting blood glucose daily.     3. HTN - continue home lisinopril    4. HLD - continue statin    5. Hypothyroidism - continue home Synthroid    6. Glaucoma - continue home Xalatan      DVT/PE Prophylaxis: Not indicated, low risk for VTE      Shelbie Proctor, DO, [facility]

## 2022-12-27 NOTE — Behavioral Health (Addendum)
Per pt request, called pt's daughter, Judson Roch. Judson Roch stated that she has [employer] glasses, and will bring them and some other belongings to her tomorrow. She stated that staff can call her to get any kind of collateral information at any time. She also stated that [facility] dies not take her medication correctly at home.    Lucious Groves, [place]

## 2022-12-27 NOTE — Care Plan (Signed)
[name] spent the evening in the dayroom watching TV with peers.  Her mood appears depressed, but she seems less anxious as yesterday.  She requested/received prns of benadryl and xanax to promote sleep.  She reports that this is her usual combination for HS.    She appeared to sleep the entire night.    Problem: Adult Behavioral Health Plan of Care  Goal: Patient-Specific Goal (Individualization)  Outcome: Ongoing (see interventions/notes)  Goal: Strengths and Vulnerabilities  Outcome: Ongoing (see interventions/notes)

## 2022-12-27 NOTE — Behavioral Health (Signed)
SAFE-T Protocol with C-SSRS ([place] Risk and Protective Factors) - Recent    Step 1: Identify Risk Factors                                                   C-SSRS Suicidal Ideation Severity Month   Wish to be dead  Have you wished you were dead or wished you could go to sleep and not wake up? yes   Current suicidal thoughts  Have you actually had any thoughts of killing yourself? yes     Suicidal thoughts w/ Method (w/no specific Plan or Intent or act)  Have you been thinking about how you might do this? yes   Suicidal Intent without Specific Plan  Have you had these thoughts and had some intention of acting on them? yes   Intent with Plan  Have you started to work out or worked out the details of how to kill yourself? Do you intend to carry out this plan? yes   C-SSRS Suicidal Behavior: "Have you ever done anything, started to do anything, or prepared to do anything to end your life?"    Examples: Collected pills, obtained a gun, gave away valuables, wrote a will or suicide note, took out pills but didn't swallow any, held a gun but changed your mind or it was grabbed from your hand, went to the roof but didn't jump; or actually took pills, tried to shoot yourself, cut yourself, tried to hang yourself, etc.  If "YES" Was it within the past 3 months? Lifetime    yes    Past 3 Months    yes   Activating Events:     Recent losses or other significant negative event(s) (legal, financial, relationship, etc.)    Treatment History:     Previous psychiatric diagnosis and treatments and Non-compliant with treatment     Other:  N/A   Clinical Status:     Hopelessness , Major Depressive Episode       Access to lethal methods: Ask specifically about presence or absence of a firearm in the home or ease of accessing:    no     Step 2: Identify Protective Factors (Protective factors may not counteract significant acute suicide risk factors)   Internal:     Identifies reasons for living External:     {Responsibility to family  or others; living with family     Step 3: Specific questioning about Thoughts, Plans, and Suicidal Intent - (see Step 1 for Ideation Severity and Behavior)   C-SSRS Suicidal Ideation Intensity (with respect to the most severe ideation 1-5 identified above) Month   Frequency  How many times have you had these thoughts?  4: daily or almost daily   Duration  When you have the thoughts how long do they last? 3: 1-4 hours/a lot of time    Controllability  Could/can you stop thinking about killing yourself or wanting to die if you want to? 4: can control thoughts with a lot of difficulty   Deterrents  Are there things - anyone or anything (e.g., family, religion, pain of death) - that stopped you from wanting to die or acting on thoughts of suicide? 4: deterrents most likely did not stop you   Reasons for Ideation  What sort of reasons did you have for thinking about wanting to die  or killing yourself?  Was it to end the pain or stop the way you were feeling (in other words you couldn't go on living with this pain or how you were feeling) or was it to get attention, revenge or a reaction from others? Or both? 5: completely to end or stop the pain (you couldn't go on living with the pain or how you were feeling)   Total Score                    20         Step 4: Guidelines to Determine Level of Risk and Develop Interventions to LOWER Risk Level  "The estimation of suicide risk, at the culmination of the suicide assessment, is the quintessential clinical judgment, since no study has identified one specific risk factor or set of risk factors as specifically predictive of suicide or other suicidal behavior."   From The American Psychiatric Association Practice Guidelines for the Assessment and Treatment of Patients with Suicidal Behaviors, page 24.   RISK STRATIFICATION TRIAGE   High Suicide Risk  Suicidal ideation with intent or intent with plan in past month (C-SSRS Suicidal Ideation #4 or #5)  Or  Suicidal behavior within  past 3 months (C-SSRS Suicidal Behavior)   Initiate local psychiatric admission process  Stay with patient until transfer to higher level of care is complete  Follow-up and document outcome of emergency psychiatric evaluation     Moderate Suicide Risk  Suicidal ideation with method, WITHOUT plan, intent or behavior       in past month (C-SSRS Suicidal Ideation #3)  Or  Suicidal behavior more than 3 months ago (C-SSRS Suicidal Behavior Lifetime)  Or  Multiple risk factors and few protective factors Directly address suicide risk, implementing suicide     prevention strategies  Develop Safety Plan     Low Suicide Risk  Wish to die or Suicidal Ideation WITHOUT method, intent, plan or behavior (C-SSRS Suicidal Ideation #1 or #2)   Or  Modifiable risk factors and strong protective factors  Or  No reported history of Suicidal Ideation or Behavior  Discretionary Outpatient Referral     Step 5: Documentation   Clinical Note     SAFE-T (Suicide Risk Assessment/Intervention):    Risk Factors: Hopeless, Intolerable emotional pain, Lacking social support, Living situation problem, Loss of physical health, Major depression, Overwhelmed, Severe loss of pleasure or interest, and Treatment alliance issues    Protective Factors: sense of responsibility to loved ones, religious beliefs, no access to firearms, sobriety, motivated for treatment, stable living environment     Risk Level:   HIGH RISK = This is because patient has psychiatric diagnoses with severe symptoms, acute precipitating event, and/or potentially lethal suicide attempt or persistent ideation with strong intent or suicide rehearsal.     Based on assessment and given the above risk level the following plan of care was developed:     Patient has been admitted for acute inpatient psychiatric treatment  Patient was able to contract for safety.   Safety controlled by monitoring within safe milieu with 15 minutes checks and video monitoring on unit  Daily treatment team,  group and individual therapies to be implemented  Patient restricted to the unit (RTU)  Safe Checks performed to validate patients well-being  Behavioral health appointments to be arranged at time of discharge  Contact with supportive persons in their life was encouraged. Patient identifies her daughter as a person of trust and support.  Patient counseled by provider on Suicide prevention and provided with written information, crisis phone numbers, and resources.    Safety Plan Intervention (SPI) to be completed by time of discharge       Lucious Groves, [facility]

## 2022-12-27 NOTE — Care Plan (Addendum)
[name] is out on the unit this am, sitting in a wheelchair. She isolates to herself. She is dressed in her own clothing. Tearful. States "I am sad." [name] states that she moved from [place] last Saturday and that the whole trip was terrible. She states that she has chronic back pain and the trip was hard on her. She also states that there is tension in her marriage and her husband "didn't eat, so he thought I didn't need to eat either. I had to ask him to stop and get me something." She also states that she moved here because her 71 yr old son and 57 yr old grandson wanted "all of [place] to live together." "I am sick of cleaning up after them. I am not their maid. Whenever my son has a girl in the picture, he gets like this. He doesn't care about anything else. We sent him so much money, and then we get here and he just leaves with the girl and we had things that needed done." Verbalizes feeling overwhelmed with the move and her current health condition. States "I told them all, 3 weeks ago, that I was feeling like this. My husband told me to just accept it. Even my psychiatrist in [place] told me I would be okay. Finally I had just ad enough and I took my Xanax, a Benadryl and a hydrocodone. I was going to take more but I called my daughter and begged her to bring me in. No one else would bring me. I had a knife with me too." Rates her anxiety and depression an 8/10 each. Denies SI/HI or hallucinations. States that she feels safe on the unit but would not feel safe for discharge. Harriette Bouillon, RN    Problem: Adult Behavioral Health Plan of Care  Goal: Patient-Specific Goal (Individualization)  Outcome: Ongoing (see interventions/notes)  Flowsheets (Taken 12/27/2022 0900)  Individualized Care Needs: Provide a safe and therapeutic environment  Anxieties, Fears or Concerns: "I'm sad"  Patient-Specific Goals (Include Timeframe): "figure this place out and feel better,"     Problem: Adult [place] of  Care  Goal: Plan of Care Review  Recent Flowsheet Documentation  Taken 12/27/2022 0900 by Harriette Bouillon, RN  Patient Agreement with Plan of Care: agrees     Problem: Depressive Signs/Symptoms  Goal: Improved Mood Symptoms (Depressive Signs/Symptoms)  Intervention: Promote Mood Improvement  Recent Flowsheet Documentation  Taken 12/27/2022 0900 by Harriette Bouillon, RN  Supportive Measures:   active listening utilized   verbalization of feelings encouraged     Problem: Depressive Signs/Symptoms  Goal: Improved Psychomotor Symptoms (Depressive Signs/Symptoms)  Intervention: Manage Psychomotor Movement  Recent Flowsheet Documentation  Taken 12/27/2022 0900 by Harriette Bouillon, RN  Activity (Behavioral Health): activity adjusted per tolerance     Problem: Depressive Signs/Symptoms  Goal: Optimized Energy Level (Depressive Signs/Symptoms)  Intervention: Optimize Energy Level  Recent Flowsheet Documentation  Taken 12/27/2022 0900 by Harriette Bouillon, RN  Activity [facility]): activity adjusted per tolerance

## 2022-12-27 NOTE — Group Note (Signed)
Group topic:  RECREATION GROUP    Date of group:  12/27/2022  Start time of group:  1430  End time of group:  [place]    Attend:  [x]   Not attend: []   Attendance:  inpatient attended all of group               Summary of group discussion: In this group, patients decorated cupcakes. They expressed enjoyment with this activity. We discussed the DC process and questions and concerns were addressed.      [name]  is a 72 y.o. female participating in a recreation group.    Patient's mental status/affect: Brightens with interaction    Patient's behavior: Calm, cooperative, minimally social    Patient's response: participated in this group. Questions regarding the DC process were answered.      Harriette Bouillon, RN  12/27/2022, 15:09

## 2022-12-27 NOTE — Behavioral Health (Signed)
Pt reports they slept good. Pt denies SI, HI, and AVH. Pt reports depression and anxiety as an 8.Pt is safe on the unit and not safe to leave. Pt is dressed in their own clothing. Pt practices hygiene. Pt's appetite is intact. Pt attended all groups. Pt has been pleasant throughout the day. Pt has reported complaints of feeling "wobbly" and unable to walk far places, and needing a wheelchair, but said "they can walk" at times when asked if they wanted their wheelchair. Pt would get up and walk and move heavy objects at times without using their wheelchair. Nurse provided walker for Pt and has been actively using it and getting around much easier. Pt has been in dayroom for majority of the day. Pt reported wanting to "figure out this place" and has appeared weary. Pt has socialized with peer for a quarter of the day. Pt appears to have a blunted affect. Pt showed good fine motor skills during group despite struggling with complaints of tremors, and was optimistic after redirection when they said "I am bad at this". Pt has appeared tired at portions of the day and seen resting their eyes on the couch in the day room. Pt is in their room with eyes closed. Will continue to monitor.     Sadiyah Kangas [place], [facility]
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
## 2022-12-27 NOTE — Behavioral Health (Signed)
Met with pt and completed Mini Mental Status Exam. Pt scored 27/30. Exam and results placed in chart.    [name], [place]

## 2022-12-27 NOTE — Group Note (Signed)
Group topic:  RECREATION GROUP    Date of group:  12/27/2022  Start time of group:  1100  End time of group:  1150    Attend:  [x]   Not attend: []   Attendance:  inpatient attended 30 minutes               Summary of group discussion: Patients participated in a game of Concho. The purpose of this activity was to improve patient mood and prompt creativity.       [name]  is a 72 y.o. female participating in a recreation group.    Patient's mental status/affect: appropriate    Patient's behavior: appropriate    Patient's response: [name] attended this group, however, she chose to not participate in the activity. She quietly observed.       Lucious Groves, [facility]  12/27/2022, 13:31

## 2022-12-27 NOTE — Behavioral Health (Signed)
Name: [name]    Sex: female    Age: 72    City of Residence: [place], [place]    Highest Grade in School: graduated high school    List any foods you can't eat because of allergic reactions: N/A    List any physical problems you have that keep you from doing activities: unsteady walking, currently using wheelchair on unit    List typical things you do during your free time: swimming, crafting, walking dog    Do you do activities with your family? No     Do you belong to any clubs, groups, teams, organizations? No    Do you ever feel bored? yes    Do you use drugs or alcohol? no     Do you engage is any high risk activities (such as playing chicken with cars/trains, jumping from high places, or any other activity that endangers your life or the life of others ? no     Do you exercise regularly? no    Are you satisfied with your current social life?  no    Do you have friends to do things with?  no    Are you satisfied with your current recreational activities ?  no    Are there enough things to do for fun in your community and in your home? no    Name one activity that helps you feel good about yourself: swimming    On a scale of 1 to 100, with 100 being very stressed, how stressed are you typically? 100    Name one activity that helps you reduce stress:  "cry until I can't cry no more"    Lucious Groves, [facility]
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
## 2022-12-27 NOTE — Group Note (Signed)
Group topic:  RECREATION GROUP    Date of group:  12/27/2022  Start time of group:  1305  End time of group:  1355    Attend:  [x]   Not attend: []   Attendance:  inpatient attended all of group               Summary of group discussion:  In group, Pts learned how to make an origami tulip via [employer] and demonstration from this staff. Pts discussed patience and accepting imperfections.      [name]  is a 72 y.o. female participating in a recreation group.    Patient's mental status/affect:    Patient's behavior:    Patient's response: Pt fully participated and engaged in activity. Pt was very focused. Pt was friendly and accepted help when they were struggling. At first, Pt said "I'm bad at this". Staff redirected and stated "You have been doing really good. It's just a hard step". Pt smiled and engaged in positive conversation trying to encourage peer to accept that it is okay if the craft has imperfections. Pt socialized and smiled with interaction.      [name] [place], [place]  12/27/2022, 14:06

## 2022-12-27 NOTE — Group Note (Signed)
Group topic:  EVENING REFLECTION / WRAP UP GROUP    Date of group:  12/27/2022  Start time of group:  2000  End time of group:  2015    Attend: []   Not attend: [x]   Attendance:  inpatient attended 0 minutes               Summary of group discussion: Clients reflected on day, if they had any anxiety or depression (1-10), any SI,HI or AVH and if they achieved their goal for the day.      [name]  is a 72 y.o. female participating in a evening reflection group.    Patient's mental status/affect:    Patient's behavior:    Patient's response: Pt was prompted but did not attend    Erin Fulling, MHS  12/27/2022, 20:17

## 2022-12-27 NOTE — Group Note (Signed)
Group topic:  GOALS/AFFIRMATIONS GROUP    Date of group:  12/27/2022  Start time of group:  0715  End time of group:  0800    Attend: [x]   Not attend: []   Attendance:  inpatient attended all of group               Summary of group discussion: Sleep (poor, fair and good), Depression/Anxiety 1-10 (10 being the worst), Suicidal (Plan contracts for safety on the unit No plan), Homicidal( Denies or Yes and towards who), Hallucinations (Visual what you see or Auditory what they are saying), Contracts safety on the unit, Safe to leave, was their goal completed today.       [name]  is a 72 y.o. female participating in a goal group.        Patient's response: Patient reports sleep is good, Depression and Anxiety 8, Denies suicidal,homicidal and hallucinations. Safe on the unit and safe for discharge. Goal-Figure out this place    Ky Barban, MHS  12/27/2022, 09:32

## 2022-12-27 NOTE — Group Note (Signed)
Psychoeducational Group Note  Date of group:  12/27/2022  Start time of group:  1015  End time of group:  1100    Attend:  [x]   Not attend:  []   Attendance:  inpatient attended all of group               Summary of group discussion: Patients were guided in a discussion about the three types of communication: passive, aggressive, and assertive. Patients were given different scenarios, and shared how a passive/aggressive/assertive person would react in each scenario. Patients then talked about their own communication styles, and how they can work on being more assertive.       [name]  s a 72 y.o. female participating in a psychoeducational group.    Affect/Mood:  Appropriate    Thought Process:  Logical    Thought Content:  Within normal limits    Interpersonal:  Discussed issues, Attentive, Displayed insight, and Provided feedback    Level of participation:  Full    Comments: [name] actively participated in this group discussion. She identified herself as an Nurse, mental health. She stated that being assertive allows her to respectfully communicate her wants and needs to others. She was attentive as the group discussed ways to become more assertive in communication.    Lucious Groves, [facility] 12/27/2022 12:57

## 2022-12-28 LAB — POC BLOOD GLUCOSE (RESULTS): GLUCOSE, POC: 91 mg/dl (ref 60–110)

## 2022-12-28 MED ORDER — FLUOXETINE 20 MG CAPSULE
40.0000 mg | ORAL_CAPSULE | Freq: Every day | ORAL | Status: DC
Start: 2022-12-29 — End: 2022-12-31
  Administered 2022-12-29 – 2022-12-31 (×3): 40 mg via ORAL
  Filled 2022-12-28 (×3): qty 2

## 2022-12-28 NOTE — Nurses Notes (Signed)
[name] is tearful.  Asks "what is the process for discharge here?" Adds "the bed is bad for my back. I can't shower properly cause the shower chair is too big. There isn't even a brush  in my room. No conditioner or shampoo". Directed patient that the showering items are at the nurses station.  Directed Barb to just ask for help and we will assist her to shower. States "I'm too emberrassed to ask for help". Explained that we are here to help. States "I already had my shower today". Explained that we will assist her tomorrow with a shower in 506 that has a shower wand to assist her. Medicated with Xanax per order. Will monitor. Burns Spain, RN

## 2022-12-28 NOTE — Group Note (Signed)
Group topic:  EVENING REFLECTION / WRAP UP GROUP    Date of group:  12/28/2022  Start time of group:  0720  End time of group:  0800    Attend: [x]   Not attend: []   Attendance:  inpatient attended all of group               Summary of group discussion:      [name]  is a 72 y.o. female participating in a evening reflection group.    Patient's mental status/affect:  brightens    Patient's behavior:calm    Patient's response: pt denies depression and anxety she denies si,hi and hallucinations safe for dc    Hassel Uphoff L. Macgregor Aeschliman, MHS  12/28/2022, 20:33

## 2022-12-28 NOTE — Behavioral Health (Signed)
[place]  [place] [address]       Name: [name] MRN:  [id number]   Date: 12/26/2022 Age: 72 y.o.       History:        Interval History:    The chart is reviewed and patient is interviewed.  Patient said" I feel better".  Patient complain about the chairs and mattress and a lot of things are not convenient for her. Patient has been using a walker today and she is able to walk in her own.  Patient has been attending groups.  Patient regretted her suicide attempt.  Patient is aware that her family got the message and patient has to communicate better with her family and not to direct her anger to herself.  Discussed with the patient about increasing the dose of the fluoxetine to 40 mg and she agrees.  Patient's sleep is poor last night.  Patient has been compliant with medications and no side effects.  Patient has been attending some groups      Review of systems:   Constitutional: Denies any medication side effect.  GI: Denies any diarrhea, constipation.      Current Facility-Administered Medications   Medication Dose Route Frequency Provider Last Rate Last Admin    acetaminophen (TYLENOL) tablet  650 mg Oral Q6H PRN Jake Seats, MD        ALPRAZolam Duanne Moron) tablet  0.5 mg Oral 3x/day PRN Jake Seats, MD   0.5 mg at 12/28/22 0956    aluminum-magnesium hydroxide-simethicone (MAG-AL PLUS) 200-200-20 mg per 5 mL oral liquid  5 mL Oral Q4H PRN Jake Seats, MD        aspirin (ECOTRIN) enteric coated tablet 81 mg  81 mg Oral Daily Jake Seats, MD   81 mg at 12/28/22 0751    diphenhydrAMINE (BENADRYL) capsule  25 mg Oral Q6H PRN Jake Seats, MD   25 mg at 12/27/22 2017    diphenhydrAMINE (BENADRYL) capsule  25 mg Oral HS PRN - MR x 1 Jake Seats, MD        docusate sodium (COLACE) capsule  100 mg Oral 2x/day PRN Jake Seats, MD        Derrill Memo ON 12/29/2022] FLUoxetine (PROzac) capsule  40 mg Oral Daily Jake Seats, MD         glipiZIDE (GLUCOTROL) tablet  2.5 mg Oral 2x/day AC Jake Seats, MD   2.5 mg at 12/28/22 0606    ibuprofen (MOTRIN) tablet  400 mg Oral Q6H PRN Jake Seats, MD        influenza virus vaccine (PF) IM injection (FLUAD for ages 65+)  0.5 mL IntraMUSCULAR Prior to Discharge Jake Seats, MD        latanoprost (XALATAN) 0.005 % ophthalmic solution  1 Drop Both Eyes QPM Jake Seats, MD   1 Drop at 12/27/22 2012    levothyroxine (SYNTHROID) tablet  25 mcg Oral Veryl Speak, MD   25 mcg at 12/28/22 [id number]    lisinopril (PRINIVIL) tablet  2.5 mg Oral Daily Jake Seats, MD   2.5 mg at 12/28/22 [id number]    multivitamin (THERA) tablet  1 Tablet Oral Daily Jake Seats, MD   1 Tablet at 12/28/22 0752    promethazine (PHENERGAN) tablet  25 mg Oral Q6H PRN Jake Seats, MD        rosuvastatin (CRESTOR) tablet  20 mg Oral QPM Jake Seats, MD   20 mg at 12/27/22 2011  Allergies: No Known Allergies      Exam:      BP 109/65   Pulse 83   Temp 36.7 C (98.1 F)   Resp 16   Ht 1.626 m (5\' 4" )   Wt 90.7 kg (200 lb)   SpO2 95%   BMI 34.33 kg/m       General appearance:  Well-developed female, in home clothes, adequately dressed, has a fair eye contact, overweight, in her stated age.  Behavior:  No psychomotor agitation or tardive dyskinesia.  Musculoskeletal:  Patient using walker for support even though patient frequently asking to walk in her own with no problem and she feels she is much more balanced today  Speech:  Normal rate and volume  Affect:  Anxious  Mood:  Irritable  Thought process:  Normal  Association:  Goal-directed  Abnormal thought:  Denies suicidal thoughts nor homicidal thoughts.  Denies auditory hallucinations nor visual hallucinations  Judgement and Insight:  Fair  Attention and concentration:  Fair  Language:  Normal    Diagnosis    ICD-10-CM    1. Depression with suicidal ideation  F32.A     R45.851            INTERVENTION: Patient continues to require an extensive level  of medical decision making due to complexity and acuity of illness and meets criteria for inpatient hospitalization. Discussed status, reviewed option for therapy. Discussed medicine, psychotherapy and structure/schedule of the program. Reviewed the plan, goal, rationale, risks, benefits and alternates. Patient indicated understanding and agreement.      Plan:   Continue admission to inpatient.  Patient is still irritable and depressed  Medications:  Increase fluoxetine to 40 mg daily.  Continue alprazolam 0.5 mg t.i.d. p.r.n.  Case management following for disposition planning  ELOS:  4-5 days    I spent over 25 minutes on this case of which 65% of the time spent with the patient and over 50% of the time spent in counseling regarding communication skills, medication Education and supportive psychotherapy    CPT code:  [place], MD  ASSESSMENT:  Patient was admitted for suicide attempt by overdose on Xanax and Benadryl and patient has a lot of conflict with family specifically her son.  Patient was on fluoxetine 80 mg but she never took the dose 80 mg and only she took 20 mg daily  March 16:  Patient started on fluoxetine 20 mg and alprazolam 0.5 t.i.d. p.r.n.  March 17:  Patient is irritable.  Patient denies suicidal thoughts and patient will start on fluoxetine 40 mg starting tomorrow    This note was partially created using voice recognition software and is inherently subject to errors including those of syntax and "sound-alike" substitutions which may escape proofreading.  In such instances, original meaning may be extrapolated by contextual derivation.

## 2022-12-28 NOTE — Group Note (Signed)
Group topic:  RECREATION GROUP    Date of group:  12/28/2022  Start time of group:  1100  End time of group:  1155    Attend:  [x]   Not attend: []   Attendance:  inpatient attended all of group               Summary of group discussion: Patients participated in a game of Heads Up! The purpose of this activity was to improve patient mood and practice effective communication.       ARTURO GUTERMUTH  is a 72 y.o. female participating in a recreation group.    Patient's mental status/affect: appropriate    Patient's behavior: cooperative    Patient's response:  Aya actively participated in this group activity with no issues. Therapist witnessed [employer], laughing, and interacting appropriately with peers throughout the group.       Lucious Groves, [facility]  12/28/2022, 12:55

## 2022-12-28 NOTE — Behavioral Health (Addendum)
Pt was out on the unit most of the evening. Pt was social with certain peers. Pt went to bed with no issues. Pt is resting in bed with eyes closed appears to be sleeping throughout the night. Q15 were maintained.       [name], MHS

## 2022-12-28 NOTE — Group Note (Signed)
Group topic:  GOALS/AFFIRMATIONS GROUP    Date of group:  12/28/2022  Start time of group:  0710  End time of group:  0750    Attend: [x]   Not attend: []   Attendance:  inpatient attended all of group               Summary of group discussion: Sleep (poor, fair and good), Depression/Anxiety 1-10 (10 being the worst), Suicidal (Plan contracts for safety on the unit No plan), Homicidal( Denies or Yes and towards who), Hallucinations (Visual what you see or Auditory what they are saying), Contracts safety on the unit, Safe to leave, was their goal completed today.       [name]  is a 72 y.o. female participating in a goal group.        Patient's response: Patient reports sleep is "Poor" Depression "not awake enough, Anxiety 2, Denies Suicidal,Homicidal and Hallucinations. Contracts safety on the unit. Safe on the unit and safe for discharge. Goal- Sleep better tonight    Ky Barban, MHS  12/28/2022, 08:48

## 2022-12-28 NOTE — Group Note (Signed)
Group topic:  EDUCATION GROUP    Date of group:  12/28/2022  Start time of group:  2000  End time of group:  2045    Attend: [x]   Not Attend:  []   Attendance:  inpatient attended all of group    Summary of group:               Summary of group discussion: Discussed any medication questions patient had. Reinforced the importance of taking medications.       [name]  is a 72 y.o. female participating in a education group.    Patient observations: Patient expressed understanding. Declined any questions.       Patient goals:      Toma Aran, RN  12/28/2022, 22:25

## 2022-12-28 NOTE — Group Note (Signed)
Psychoeducational Group Note  Date of group:  12/28/2022  Start time of group:  1000  End time of group:  74    Attend:  [x]   Not attend:  []   Attendance:  inpatient attended all of group               Summary of group discussion: Patients were guided in a discussion about gratitude, and how practicing it regularly and increase positive thinking and improve overall mental health. Patients were provided a worksheet with many lines, in which they were instructed to write something that they are grateful for on each line, ultimately creating a gratitude list. Patients then shared their lists, and discussed how it felt to focus on the positive aspects of their lives.       [name]  s a 72 y.o. female participating in a psychoeducational group.    Affect/Mood:  Appropriate    Thought Process:  Logical    Thought Content:  Within normal limits    Interpersonal:  Discussed issues, Attentive, Displayed insight, and Provided feedback    Level of participation:  Full    Comments: [name] actively participated in this group discussion. She was able to listed several items for her gratitude list, including her husband, children, grandchildren, dog, home, and more. [name] acknowledged that practicing gratitude regularly can help improve overall mood and increase positive thinking.     Lucious Groves, [facility] 12/28/2022 12:12

## 2022-12-28 NOTE — Behavioral Health (Signed)
Pt slept "poor". Pt reported they are "not awake enough" to answer how their depression is. Reports anxiety as a 2. Pt denies SI and AVH. Pt is safe on the unit and safe to discharge. Pt's appetite is intact. Pt is dressed in own clothing. Pt has attended all groups. Pt has been in the day room socializing on the unit all day. Pt bonds with one peer most. Pt has been seen laughing, joking, and smiling during Juel Burrow and conversations with said peer throughout the day. Pt has been helping a Pt by socializing with them and providing guidance. Will continue to monitor.    Danya Spearman [place], MHS

## 2022-12-28 NOTE — Group Note (Signed)
Group topic:  RECREATION GROUP    Date of group:  12/28/2022  Start time of group:  1430  End time of group:  1500    Attend:  [x]   Not attend: []   Attendance:  inpatient attended all of group               Summary of group discussion: Patients created a [place] [place] [employer] craft, using their creativity. This activity promoted socialization.       CLARIA GIARRUSSO  is a 72 y.o. female participating in a recreation group.    Patient's mental status/affect: Brightens with interaction    Patient's behavior: Calm and cooperative    Patient's response: Sat at a table with peers and created a craft. She was social, calm and cooperative.       Harriette Bouillon, RN  12/28/2022, 14:59

## 2022-12-28 NOTE — Group Note (Signed)
Group topic:  RECREATION GROUP    Date of group:  12/28/2022  Start time of group:  1330  End time of group:  1415    Attend:  [x]   Not attend: []   Attendance:  inpatient attended all of group               Summary of group discussion:  In group, Pts played [place] and socialized.      [name]  is a 72 y.o. female participating in a recreation group.    Patient's mental status/affect:    Patient's behavior:    Patient's response: Pt fully participated and engaged in group. Pt was pleasant and attempted to help peers with keeping up with their turns. Pt was seen smiling and laughing.      [name], [place]  12/28/2022, 14:29

## 2022-12-28 NOTE — Care Plan (Signed)
Caity is up on the unit.  Utilizing her walker without difficulty. States "these beds are too hard on my back." Adds "I may need something for anxiety later. Denies suicide or homicide.  Denies hallucinations. States she slept "poor". Addomg "bed is terrible and the room is too cold". When asked question of rating her depression states "I am not awake enough to rate it". Rates anxiety as a level 2 on a scale of 1-10 with 10 being the worst. She is safe here and safe to go home.  Her goal for the day is to "sleep better tonight". She has a flat affect. She does interact with peers. She is appropriate with staff.  Med compliant. Attending groups. Will monitor. Burns [place], RN  Problem: Adult Behavioral Health Plan of Care  Goal: Patient-Specific Goal (Individualization)  Flowsheets (Taken 12/28/2022 [phone])  Anxieties, Fears or Concerns: These beds are too hard on my back

## 2022-12-29 ENCOUNTER — Encounter (HOSPITAL_COMMUNITY): Payer: Self-pay

## 2022-12-29 DIAGNOSIS — F4320 Adjustment disorder, unspecified: Secondary | ICD-10-CM

## 2022-12-29 DIAGNOSIS — F332 Major depressive disorder, recurrent severe without psychotic features: Secondary | ICD-10-CM

## 2022-12-29 LAB — POC BLOOD GLUCOSE (RESULTS): GLUCOSE, POC: 124 mg/dl — ABNORMAL HIGH (ref 60–110)

## 2022-12-29 MED ORDER — FLU VACC 2023-24(65YR UP)-MF59C(PF) 60 MCG(15 MCGX4)/0.5 ML IM SYRINGE
0.5000 mL | INJECTION | INTRAMUSCULAR | Status: DC
Start: 2022-12-29 — End: 2022-12-29
  Administered 2022-12-29: 0 mL via INTRAMUSCULAR
  Filled 2022-12-29: qty 0.5

## 2022-12-29 MED ORDER — FLU VACC 2023-24(65YR UP)-MF59C(PF) 60 MCG(15 MCGX4)/0.5 ML IM SYRINGE
0.5000 mL | INJECTION | INTRAMUSCULAR | Status: AC
Start: 2022-12-29 — End: 2022-12-29
  Administered 2022-12-29: 0.5 mL via INTRAMUSCULAR
  Filled 2022-12-29: qty 0.5

## 2022-12-29 MED ORDER — MIRTAZAPINE 15 MG TABLET
7.5000 mg | ORAL_TABLET | Freq: Every evening | ORAL | Status: DC
Start: 2022-12-29 — End: 2022-12-31
  Administered 2022-12-29 – 2022-12-30 (×2): 7.5 mg via ORAL
  Filled 2022-12-29 (×2): qty 1

## 2022-12-29 MED ORDER — NICOTINE 21 MG/24 HR DAILY TRANSDERMAL PATCH
21.0000 mg | MEDICATED_PATCH | Freq: Every day | TRANSDERMAL | Status: DC
Start: 2022-12-29 — End: 2022-12-31
  Administered 2022-12-29 – 2022-12-30 (×2): 0 mg via TRANSDERMAL
  Administered 2022-12-31: 21 mg via TRANSDERMAL
  Filled 2022-12-29: qty 1

## 2022-12-29 NOTE — Care Plan (Signed)
[name] is out on the unit, sitting at a table with peers. Dressed in own clothing. She is social with select peers. States "I feel so much better. I had so much fun yesterday and I woke up not feeling anxious or depressed at all. I feel great." States that she slept well. Denies SI/HI or hallucinations.  States that she feels safe on the unit and would feel safe off the unit. Her goal is to "state the way I feel." She is med compliant and is attending groups. Will monitor. Harriette Bouillon, RN    Problem: Adult Behavioral Health Plan of Care  Goal: Patient-Specific Goal (Individualization)  Outcome: Ongoing (see interventions/notes)  Flowsheets (Taken 12/29/2022 0945)  Individualized Care Needs: Provide a safe and therapeutic environment  Anxieties, Fears or Concerns: denies  Patient-Specific Goals (Include Timeframe): "say how I feel."     Problem: Adult [place] of Care  Goal: Plan of Care Review  Recent Flowsheet Documentation  Taken 12/29/2022 0945 by Harriette Bouillon, RN  Patient Agreement with Plan of Care: agrees     Problem: Depressive Signs/Symptoms  Goal: Improved Mood Symptoms (Depressive Signs/Symptoms)  Intervention: Promote Mood Improvement  Recent Flowsheet Documentation  Taken 12/29/2022 0945 by Harriette Bouillon, RN  Supportive Measures:   active listening utilized   verbalization of feelings encouraged  Diversional Activity: (playing cards) other (see comments)

## 2022-12-29 NOTE — Nurses Notes (Signed)
Pt out on unit most of first part of shift (7p-11p).  Affect dull with minimal brightening upon approach or with interaction.  Mood depressed and anxious.  Bhs calm and cooperative.  Pt denies suicidal and/or homicidal ideation.  Denies AVOT hallucinations.  Pt did attend and participate in evening wrap up group as well as nursing education group.  Medication compliant and required PRN Xanax and Benadryl.  Pt reports that she is safe on the unit as well as safe for discharge at this time.  Retired to her assigned room at 2100.  Will continue to monitor per protocol.

## 2022-12-29 NOTE — Behavioral Health (Signed)
PROGRESS NOTE        Telemedicine Documentation: This is a telemedicine note. Patient was treated using telemedicine, real time audio and video  Patient Location: [facility]  Provider Location: [facility]  Patient/family aware of provider location: yes  Patient/family consent for telemedicine: yes  Examination observed and performed by: [name] provider: Berniece Pap, MD  -------------------------------------------------------------------------------------------------------  Hospital Day: 4  Start Time: 11:39     CHIEF COMPLAINT:  Inpatient daily psychiatric reevaluation    INTERVAL HISTORY:   Staff reports that: "reports sleeping good, depression and anxiety are both 0/10, No-SI/HI or hallucinations of any, feels safe on the unit safe to leave , goal is to stay the way i feel"   Sleep- is good  Appetite- is good  ADLs- intact;   PRN Meds- xanax and benadryl at 830pm.  Behavior- attending groups, pleasant, social, med compliant- PT is working with her        On talking to patient: Patient states that Mood is significantly improved after cutting back on gabapentin, xanax and Prozac . States that 80 mg of Prozac was upsetting her belly. She is tolerating the lower dose well. She was taking Xanax 3 times  a day and finding that's he was off balance and weak. Now able to ambulate independently.   Denies AVH/ paranoia/ anxiety or W/D symptoms.   Affect is bright- she would like her family educated about "listening" to her when she is in distress. She moved about 10 days ago and has not adjusted to the move.   Side effects: no  headaches, nausea/ diarrhea/ constipation. Does not report lightheadedness/ palpitations or postural unsteadiness.   No EPS      CURRENT PSYCHIATRIC REVIEW OF SYMPTOMS:  Auditory Hallucinations:  NO  Visual Hallucinations: NO  Paranoid ideation: NO    Homicidal Ideation: NO  Suicidal Ideation: NO        SOCIAL HISTORY:  Reviewed; no new information provided today. Please see the  Admission note and Social Work documentation.      ALLERGIES   No Known Allergies       MEDICAL REVIEW OF SYSTEMS  11 ROS reviewed, negative or normal or has otherwise specified in subjective or HPI      VITALS  Blood pressure 136/69, pulse 71, temperature 36.6 C (97.8 F), resp. rate 16, height 1.626 m (5\' 4" ), weight 90.7 kg (200 lb), SpO2 96%.    LABS  Results for orders placed or performed during the hospital encounter of 12/26/22 (from the past 24 hour(s))   POC BLOOD GLUCOSE (RESULTS)   Result Value Ref Range    GLUCOSE, POC 124 (H) 60 - 110 mg/dl       SCHEDULED MEDS:    aspirin (ECOTRIN) enteric coated tablet 81 mg 81 mg Daily    FLUoxetine (PROzac) capsule 40 mg Daily    glipiZIDE (GLUCOTROL) tablet 2.5 mg 2x/day AC    influenza virus vaccine (PF) IM injection (FLUAD for ages 65+) 0.5 mL Now    latanoprost (XALATAN) 0.005 % ophthalmic solution 1 Drop QPM    levothyroxine (SYNTHROID) tablet 25 mcg QAM    lisinopril (PRINIVIL) tablet 2.5 mg Daily    multivitamin (THERA) tablet 1 Tablet Daily    nicotine (NICODERM CQ) transdermal patch (mg/24 hr) 21 mg Daily    rosuvastatin (CRESTOR) tablet 20 mg QPM      MENTAL STATUS EXAMINATION  Setting: in private room  Appearance:  appears stated age in street clothes appropriate grooming  Level of Consciousness:  Alert   Behavior: Pleasant, cooperative  Eye contact: good eye contact  Gait: Not tested  Psychomotor: no abnormal movements and no agitation or retardation   Speech:  normal rate, volume, rhythm, tone, and amount  Thought Process:  linear, goal directed  Thought Content:  no suicidal or homicidal ideation   Perception: no auditory or visual hallucinations  Affect: euthymic with normal range, reactivity is present, congruent with mood, appropriate to thought content, and no lability is noted  Mood:   "feel great"  Orientation: fully oriented to person, place, time, and situation   Attention:   intact and sustained  Concentration:  conversationally  appropriate  Memory:  intact for recent, remote, and autobiographical information  Language:   fluent  Fund of Knowledge:  adequate  Abstraction:  able to abstract  Insight:  has intellectual insight but lacks emotional insight  Judgment:  fair  Impulse Control:  fair  ----------------------------------------------------------------------------------  COLUMBIA-SUICIDE SEVERITY RATING SCALE (C-SSRS)                                 RISK ASSESSMENT    Suicidal and Self-Injurious Behavior in last 3 months  Actual suicide attempt    Suicidal and Self-Injurious Behavior in LIFETIME   Actual suicide attempt    Suicidal Ideation (Check Most Severe in Past Month)  Suicidal thoughts with method (but without specific plan or intent to act)    Activating Events (Recent)  Recent loss(es) or other significant negative event(s) (legal, financial, relationship, etc.)  DESCRIBE: moved from [facility]    Treatment History  Previous psychiatric diagnoses and treatments    Other Risk Factors  N/A    Clinical Status (Recent)  Other: adjustment    Protective Factors (Recent)  Identifies reasons for living  Responsibility to family or others; living with family  Supportive social network or family    Other Protective Factors:    Believably reports no overpowering urge to hurt self or others  Not feeling like such a burden to others that death would be a relief to them  Can maintain or regain composure while talking about the acute precipitants  Acknowledges and is motivated to cope with life stressors  Would not want one's dangerous behavior to hurt others  Symptoms known to be risk factors diminish during intervention  Makes progress resolving the crisis  Engages constructively with treatment staff  Shows interest in non-inpatient treatment  Presented voluntarily seeking help      SUICIDAL RISK ASSESSMENT: MODERATE  -------------------------------------------------------------------------------------  ASSESSMENT: [name] is a 72 y.o.  female who was admitted By DR Higinio Plan: Patient was admitted for suicide attempt by overdose on Xanax and Benadryl and patient has a lot of conflict with family specifically her son.  Patient was on fluoxetine 80 mg but she never took the dose 80 mg and only she took 20 mg daily  March 16:  Patient started on fluoxetine 20 mg and alprazolam 0.5 t.i.d. p.r.n.  March 17:  Patient is irritable.  Patient denies suicidal thoughts and patient will start on fluoxetine 40 mg starting tomorrow    I assumed care of pt on 3/18: she reports doing very well currently- denies depression/ anxiety and is remorseful for her attempt. She does blame her family. Tolerating meds- no changes.       DIAGNOSIS:  Adjustment Disorder  MDD recurrent, severe without psychosis  PLAN:  MEDICATIONS:   - Cont Cap Prozac 40 mg po daily  Limit Xanax daily use- educated patient    Continue admission to inpatient    INTERVENTION: Patient continues to require an extensive level of medical decision making due to complexity and acuity of illness and meets criteria for inpatient hospitalization. Discussed status, reviewed option for therapy. Discussed medicine, psychotherapy and structure/schedule of the program. Reviewed the plan, goal, rationale, risks, benefits and alternates. Patient indicated understanding and agreement.      ADDITIONAL RECOMMENDATIONS:   - Case management following for disposition planning      ELOS: 3 days       -------------------------------------------------------------------------------------      CPT Code:  [id number] Level 2 Hospital Progress Note (if by time, at least 35 min)      Berniece Pap, MD   _______________________________

## 2022-12-29 NOTE — OT Evaluation (Signed)
12/29/22 1143   Rehab Session   Document Type evaluation   Total OT Minutes: 15   Patient Effort good   General Information   Patient Profile Reviewed yes   Respiratory Status room air   Living Environment   Lives With spouse;child(ren), adult;grandchild(ren)   Living Arrangements house   Functional Level Prior   Ambulation 0 - independent   Transferring 0 - independent   Toileting 0 - independent   Bathing 0 - independent   Dressing 0 - independent   Eating 0 - independent   Communication 0 - understands/communicates without difficulty   Coping/Psychosocial Response Interventions   Plan Of Care Reviewed With patient   Trust Relationship/Rapport care explained;choices provided   Cognitive Assessment/Interventions   Behavior/Mood Observations behavior appropriate to situation, WNL/WFL   Orientation Status oriented x 4   Attention WNL/WFL   Follows Commands WNL   RUE Assessment   RUE Assessment WFL- Within Functional Limits   LUE Assessment   LUE Assessment WFL- Within Functional Limits   Transfer Assessment/Treatment   Sit-Stand Independence independent   Stand-Sit Independence independent   Transfer Comment Ambulated short distance in day room independently. Stood for coverstaion 5 minutes without difficulty   Bathing Assessment/Training   Comment Nurse and patient report showering last night without difficulty. Shower chair here if needed. She reports she tub bathes at home   Upper Body Dressing Assessment/Training   Independence Level  independent   Lower Body Dressing Assessment/Training   Independence Level  independent   Toileting Assessment/Training   Independence Level  independent   Grooming/Oral Hygeine  Assessment/Training   Independence Level independent   Balance Skill Training   Sitting Balance: Static good balance   Sitting, Dynamic (Balance) good balance   Sit-to-Stand Balance good balance   Standing Balance: Static good balance   Standing Balance: Dynamic good balance   Post Treatment Status    Post Treatment Patient Status Patient sitting in bedside chair or w/c   Support Present Post Treatment  Nurse present   Clinical Impression   Therapy Frequency Evaluation Only   Predicted Duration of Therapy evaluation only   Discharge Summary, OT Eval   Reason for Discharge no further needs identified

## 2022-12-29 NOTE — Group Note (Signed)
Group topic:  RECREATION GROUP    Date of group:  12/29/2022  Start time of group:  1300  End time of group:  1400    Attend:  [x]   Not attend: []   Attendance:  inpatient attended all of group               Summary of group discussion: Pts participated in [place], yahtzee, drawing, and music listening to promote positive socialization and relaxation.       [name]  is a 72 y.o. female participating in a recreation group.    Patient's mental status/affect: appropriate, pleasant    Patient's behavior: appropriate, social    Patient's response: PT participated in [place] and music listening and was pleasant and social with others while in group.       Sharon Seller, Recreation Therapist  12/29/2022, 14:24

## 2022-12-29 NOTE — Behavioral Health (Signed)
Pt attended treatment team with Dr. Shon Millet via telehealth, therapist [name], and nurse [name].  [name] reports that she feels better now. She stated that yesterday and today have been good days for her. She stated that she enjoys the groups and socializing with the other patients. She reports that prior to admission, she felt very overwhelmed with her recent move, and that nobody was listening to her at home, causing her to feel like she was not being heard. She stated that Dr. Higinio Plan agreed to follow her, which she is pleased about. Social services will reach out to family to discuss assisting [name] by dispensing her medications to her.     Discharge: Wed or Thurs  Follow up: [facility] outpatient    [name], [facility]

## 2022-12-29 NOTE — Group Note (Signed)
Group topic:  RECREATION GROUP    Date of group:  12/29/2022  Start time of group:  1000  End time of group:  1050    Attend:  [x]   Not attend: []   Attendance:  inpatient attended all of group               Summary of group discussion: Pts participated in discussion about healthy coping skills and using hobbies to redirect thoughts. As an example, pts followed instruction to complete origami frogs.       [name]  is a 72 y.o. female participating in a recreation group.    Patient's mental status/affect: appropriate, pleasant    Patient's behavior: appropriate, social    Patient's response: PT participated in discussion and activity and was pleasant and social with others. Pt completed task with only minimal assistance and stated she enjoyed herself.       Sharon Seller, Recreation Therapist  12/29/2022, 10:56

## 2022-12-29 NOTE — Care Plan (Signed)
Pt resting in bed with eyes closed.  Resp quiet and non-labored.  Provided calm and quiet environment conducive to sleep.  Pt appears to have slept well throughout shift.  Will continue to monitor per protocol.  Problem: Adult Behavioral Health Plan of Care  Goal: Patient-Specific Goal (Individualization)  Outcome: Ongoing (see interventions/notes)

## 2022-12-29 NOTE — Behavioral Health (Signed)
Pt is resting in bed. She slept all night

## 2022-12-29 NOTE — Group Note (Signed)
Group topic:  GOALS/AFFIRMATIONS GROUP    Date of group:  12/29/2022  Start time of group:  0730  End time of group:  0800    Attend: [x]   Not attend: []   Attendance:  inpatient attended all of group               Summary of group discussion:  Sleep (poor, fair and good), Depression/Anxiety 1-10 (10 being the worst), Suicidal (Plan contracts for safety on the unit No plan), Homicidal( Denies or Yes and towards who), Hallucinations (Visual what you see or Auditory what they are saying), Contracts safety on the unit, Safe to leave, was their goal completed today.       [name]  is a 71 y.o. female participating in a goal group.    Patient's mental status/affect:    Patient's behavior:    Patient's response: reports sleeping good, depression and anxiety are both 0/10, No-SI/HI or hallucinations of any, feels safe on the unit safe to leave , goal is to stay the way i feel     Geryl Rankins, MHS  12/29/2022, 08:46

## 2022-12-29 NOTE — Group Note (Signed)
Group topic:  EVENING REFLECTION / WRAP UP GROUP    Date of group:  12/29/2022  Start time of group:  0720  End time of group:  0810    Attend: [x]   Not attend: []   Attendance:  inpatient attended all of group               Summary of group discussion:      [name]  is a 72 y.o. female participating in a evening reflection group.    Patient's mental status/affect: brightens    Patient's behavior: calm     Patient's response: pt denies depression and anxiety she denies si,hi and hallucinations safe for dc    Carolyna Yerian L. Mineola Duan, MHS  12/29/2022, 20:44

## 2022-12-30 DIAGNOSIS — F332 Major depressive disorder, recurrent severe without psychotic features: Principal | ICD-10-CM

## 2022-12-30 LAB — POC BLOOD GLUCOSE (RESULTS): GLUCOSE, POC: 108 mg/dl (ref 60–110)

## 2022-12-30 MED ORDER — LIP PROTECTANT 0.6 %-0.5 %-1.1 %-0.5 % TOPICAL OINTMENT
TOPICAL_OINTMENT | CUTANEOUS | Status: AC | PRN
Start: 2022-12-30 — End: ?
  Filled 2022-12-30: qty 4.25

## 2022-12-30 NOTE — Group Note (Signed)
Group topic:  EDUCATION GROUP    Date of group:  12/30/2022  Start time of group:  2030  End time of group:  2045    Attend: [x]   Not Attend:  []   Attendance:  inpatient attended all of group    Summary of group:  Relaxation             Summary of group discussion: Relaxation using the light and music.       [name]  is a 72 y.o. female participating in a education group.    Patient observations: Acceptance      Patient goals:      Tressia Miners, RN  12/30/2022, 20:45

## 2022-12-30 NOTE — Group Note (Signed)
Group topic:  RECREATION GROUP    Date of group:  12/30/2022  Start time of group:  1300  End time of group:  1400    Attend:  [x]   Not attend: []   Attendance:  inpatient attended all of group               Summary of group discussion: Pts participated in yahtzee, coloring, word puzzles, and music listening to promote positive socialization and relaxation.       [name]  is a 72 y.o. female participating in a recreation group.    Patient's mental status/affect: appropriate, pleasant    Patient's behavior: appropriate, social    Patient's response: PT participated in [place]htzee and music listening. Pt was irritated by a very talkative peer, but was able to see humor in this and remained on task.       Sharon Seller, Recreation Therapist  12/30/2022, 14:12

## 2022-12-30 NOTE — Group Note (Signed)
Group topic:  EDUCATION GROUP    Date of group:  12/30/2022  Start time of group:  2015  End time of group:  2045    Attend: [x]   Not Attend:  []   Attendance:  inpatient attended all of group    Summary of group:  Discussed with pts the correlation between sleep and mood stability             Summary of group discussion:  Sleep      [name]  is a 72 y.o. female participating in a education group.    Patient observations:  Pt did attend and participate in group activity      Patient goals:      Wandra Scot, RN  12/30/2022, 05:09

## 2022-12-30 NOTE — Care Plan (Signed)
[name] is out on the unit. Dressed in her own clothing. States that she showered this am.  She is social with peers. States that she slept great. Denies anxiety or depression. Denies SI/HI or hallucinations. States that she feels safe on the unit and would feel safe off the unit. [name] states that she has been talking to her husband on the phone and that her conversations with him have been good. Her goal is to stay consistent.  She is med compliant and has been attending groups. Will monitor. Harriette Bouillon, RN    Problem: Adult [place] of Care  Goal: Patient-Specific Goal (Individualization)  12/30/2022 1202 by Harriette Bouillon, RN  Outcome: Ongoing (see interventions/notes)  12/30/2022 1202 by Harriette Bouillon, RN  Outcome: Ongoing (see interventions/notes)     Problem: Adult [place] of Care  Goal: Adheres to Safety Considerations for Self and Others  Intervention: Develop and Maintain Individualized Safety Plan  Recent Flowsheet Documentation  Taken 12/30/2022 0915 by Harriette Bouillon, RN  Safety Measures: safety rounds completed

## 2022-12-30 NOTE — Nurses Notes (Signed)
Patient reports feeling anxious because another patient was not feeling well in group and "didn't look good." She states "and I think I am with drawling from this Xanax."    Medicated with Xanax 0.5 mg per her request. .Will monitor.

## 2022-12-30 NOTE — Care Plan (Signed)
Pt resting in bed with eyes closed.  Resp quiet and non-labored.  Provided calm and quiet environment conducive to sleep.  Pt appears to have slept well throughout shift.  Will continue to monitor per protocol.  Problem: Adult Behavioral Health Plan of Care  Goal: Patient-Specific Goal (Individualization)  Outcome: Ongoing (see interventions/notes)

## 2022-12-30 NOTE — Behavioral Health (Signed)
Pt is resting in bed. She was up once earlier in the night. Slept approx 6 hours

## 2022-12-30 NOTE — Group Note (Signed)
Group topic:  GOALS/AFFIRMATIONS GROUP    Date of group:  12/30/2022  Start time of group:  0800  End time of group:  0830    Attend: [x]   Not attend: []   Attendance:  inpatient attended all of group               Summary of group discussion:  Discussed how patients are feeling this morning.  Discussed how patients slept.  Discussed if patients are having suicidal ideation. Patients were asked to set a daily goal on the feeling scale.      [name]  is a 72 y.o. female participating in a goal group.    Patient's mental status/affect:[name] reports that she feels "good" this morning.  Denies feelings of depression and anxiety. Denies suicidal and homicidal ideation. Denies hallucinations and no delusions voiced.  Affect is blunt but brightens upon approach.  Maintains eye contact throughout conversation.  Reports that sleep lst night was "great"    Patient's behavior:  Cooperative with answering questions during group session.    Patient's response:  Set a daily goal to stay consistant    Christean Silvestri L. Sanjuana Kava, RN  12/30/2022, 09:00

## 2022-12-30 NOTE — Group Note (Signed)
Group topic:  RECREATION GROUP    Date of group:  12/30/2022  Start time of group:  1000  End time of group:  1100    Attend:  [x]   Not attend: []   Attendance:  inpatient attended all of group               Summary of group discussion: Pts participated in discussion about creativity and how it can dull when struggling with mental illness. As a way to stimulate creativity, pts played a Arts administrator game, prompting Picasso style portraits.      AQUA UNTHANK  is a 72 y.o. female participating in a recreation group.    Patient's mental status/affect: appropriate, pleasant    Patient's behavior: appropriate, social    Patient's response: PT participated in discussion and activity and was pleasant and social with others. Pt stated she enjoyed herself and treated the drawing "as like a trash can for my thoughts."      Sharon Seller, Recreation Therapist  12/30/2022, 11:20

## 2022-12-30 NOTE — Nurses Notes (Signed)
Pt out on unit entire first part of shift (7p-11p).  Affect dull with minimal brightening upon approach and with interaction.  Mood depressed and anxious.  Bhs calm and cooperative.  Pt denies suicidal and/or homicidal ideation.  Denies AVOT hallucinations.  Pt did attend and participate in evening wrap up group as well as nursing education group.  Medication compliant and required no PRN medications.  Pt reports that she is safe on the unit as well as safe for discharge at this time.  Retired to her assigned room at 2200.  Will continue to monitor per protocol.

## 2022-12-30 NOTE — Group Note (Signed)
Group topic:  EDUCATION GROUP    Date of group:  12/30/2022  Start time of group:  1400  End time of group:  1500    Attend: [x]   Not Attend:  []   Attendance:  inpatient attended all of group    Summary of group:               Summary of group discussion:  Pts were led in a group discussion on the topics of their choosing. They wished to know about voluntary and involuntary status as well as rules and policies for the unit. Questions were asked and answered. Examples provided.      [name]  is a 72 y.o. female participating in a education group.    Patient observations:  Pt attentive throughout offering opinions and comments on topics discussed      Patient goals:   To understand the difference between the above and the unit policies and rules      [name], [place]  12/30/2022, 17:34

## 2022-12-30 NOTE — Behavioral Health (Signed)
PROGRESS NOTE        Telemedicine Documentation: This is a telemedicine note. Patient was treated using telemedicine, real time audio and video  Patient Location: [facility]  Provider Location: [facility]  Patient/family aware of provider location: yes  Patient/family consent for telemedicine: yes  Examination observed and performed by: Nance Pew provider: Berniece Pap, MD  -------------------------------------------------------------------------------------------------------  Hospital Day: 5  Start Time: 14:34     CHIEF COMPLAINT:  Inpatient daily psychiatric reevaluation    INTERVAL HISTORY:   Staff reports that:   Sleep- is good- slept later than usual  Appetite- is good  ADLs- intact;   PRN Meds- took xanax twice yesterday and then again today.   Behavior- attending groups, pleasant, social, med compliant- PT is working with her and reports that she can ambulate independently.     On talking to patient: States that she slept great. Denies anxiety or depression. Denies SI/HI or hallucinations. Denies AVH/ paranoia/ anxiety or W/D symptoms.   Affect is bright- remorseful for attempt. Wants DC. Talking to family and described her interaction.   Side effects: no  headaches, nausea/ diarrhea/ constipation. Does not report lightheadedness/ palpitations or postural unsteadiness.   No EPS      CURRENT PSYCHIATRIC REVIEW OF SYMPTOMS:  Auditory Hallucinations:  NO  Visual Hallucinations: NO  Paranoid ideation: NO    Homicidal Ideation: NO  Suicidal Ideation: NO        SOCIAL HISTORY:  Reviewed; no new information provided today. Please see the Admission note and Social Work documentation.      ALLERGIES   No Known Allergies       MEDICAL REVIEW OF SYSTEMS  11 ROS reviewed, negative or normal or has otherwise specified in subjective or HPI      VITALS  Blood pressure (!) 144/75, pulse 79, temperature 36.2 C (97.1 F), resp. rate 20, height 1.626 m (5\' 4" ), weight 90.7 kg (200 lb), SpO2 97%.    LABS  Results  for orders placed or performed during the hospital encounter of 12/26/22 (from the past 24 hour(s))   POC BLOOD GLUCOSE (RESULTS)   Result Value Ref Range    GLUCOSE, POC 108 60 - 110 mg/dl       SCHEDULED MEDS:    aspirin (ECOTRIN) enteric coated tablet 81 mg 81 mg Daily    FLUoxetine (PROzac) capsule 40 mg Daily    glipiZIDE (GLUCOTROL) tablet 2.5 mg 2x/day AC    latanoprost (XALATAN) 0.005 % ophthalmic solution 1 Drop QPM    levothyroxine (SYNTHROID) tablet 25 mcg QAM    lisinopril (PRINIVIL) tablet 2.5 mg Daily    mirtazapine (REMERON) tablet 7.5 mg NIGHTLY    multivitamin (THERA) tablet 1 Tablet Daily    nicotine (NICODERM CQ) transdermal patch (mg/24 hr) 21 mg Daily    rosuvastatin (CRESTOR) tablet 20 mg QPM      MENTAL STATUS EXAMINATION  Setting: in private room  Appearance:  appears stated age in street clothes appropriate grooming  Level of Consciousness:  Alert   Behavior: Pleasant, cooperative  Eye contact: good eye contact  Gait: Not tested  Psychomotor: no abnormal movements and no agitation or retardation   Speech:  normal rate, volume, rhythm, tone, and amount  Thought Process:  linear, goal directed  Thought Content:  no suicidal or homicidal ideation   Perception: no auditory or visual hallucinations  Affect: euthymic with normal range, reactivity is present, congruent with mood, appropriate to thought content, and no lability is noted  Mood:   "feel great"  Orientation: fully oriented to person, place, time, and situation   Attention:   intact and sustained  Concentration:  conversationally appropriate  Memory:  intact for recent, remote, and autobiographical information  Language:   fluent  Fund of Knowledge:  adequate  Abstraction:  able to abstract  Insight:  has intellectual insight but lacks emotional insight  Judgment:  fair  Impulse Control:   fair  ----------------------------------------------------------------------------------  -------------------------------------------------------------------------------------  ASSESSMENT: [name] is a 72 y.o. female who was admitted By DR Higinio Plan: Patient was admitted for suicide attempt by overdose on Xanax and Benadryl and patient has a lot of conflict with family specifically her son.  Patient was on fluoxetine 80 mg but she never took the dose 80 mg and only she took 20 mg daily  March 16:  Patient started on fluoxetine 20 mg and alprazolam 0.5 t.i.d. p.r.n.  March 17:  Patient is irritable.  Patient denies suicidal thoughts and patient will start on fluoxetine 40 mg starting tomorrow    I assumed care of pt on 3/18: she reports doing very well currently- denies depression/ anxiety and is remorseful for her attempt. She does blame her family. Tolerating meds- no changes.   3/19: reports that she called family and expressed remorse for suicidal thoughts. Doing well. No changes.     DIAGNOSIS:  Adjustment Disorder  MDD recurrent, severe without psychosis     PLAN:  MEDICATIONS:   - Cont Cap Prozac 40 mg po daily  Limit Xanax daily use- educated patient    Continue admission to inpatient    INTERVENTION: Patient continues to require an extensive level of medical decision making due to complexity and acuity of illness and meets criteria for inpatient hospitalization. Discussed status, reviewed option for therapy. Discussed medicine, psychotherapy and structure/schedule of the program. Reviewed the plan, goal, rationale, risks, benefits and alternates. Patient indicated understanding and agreement.      ADDITIONAL RECOMMENDATIONS:   - Case management following for disposition planning      ELOS: 3 days       -------------------------------------------------------------------------------------      CPT Code:  [id number] Level 2 Hospital Progress Note (if by time, at least 35 min)      Berniece Pap, MD    _______________________________

## 2022-12-30 NOTE — Group Note (Signed)
Group topic:  EVENING REFLECTION / WRAP UP GROUP    Date of group:  12/30/2022  Start time of group:  0720  End time of group:  0800    Attend: [x]   Not attend: []   Attendance:  inpatient attended all of group               Summary of group discussion:      [name]  is a 72 y.o. female participating in a evening reflection group.    Patient's mental status/affect: brightens    Patient's behavior:calm    Patient's response: pt denies depression and anxiety she denies si,hi and hallucinations safe for dc    Kemani Heidel L. Kaelan Amble, MHS  12/30/2022, 20:51

## 2022-12-30 NOTE — Care Plan (Signed)
12/30/22 1326   Rehab Session   Document Type therapy progress note (daily note)   Total PT Minutes: 15   Patient Effort good   General Information   Patient Profile Reviewed yes   Respiratory Status room air   Pre Treatment Status   Pre Treatment Patient Status Patient sitting in bedside chair or w/c   Support Present Pre Treatment  Clinical assistant present   Cognitive Assessment/Interventions   Behavior/Mood Observations behavior appropriate to situation, WNL/WFL   Pain Assessment   Pre/Posttreatment Pain Comment no c/o pain   Transfer Assessment/Treatment   Sit-Stand Independence independent   Stand-Sit Independence independent   Gait Assessment/Treatment   Independence  independent   Distance in Feet 200 feet   Comment Patient was safe with ambulation. She does have a wheeled walker at home she usses when she does feel unsteady.   Balance Skill Training   Sitting Balance: Static good balance   Sitting, Dynamic (Balance) good balance   Sit-to-Stand Balance good balance   Standing Balance: Static good balance   Standing Balance: Dynamic good balance   Post Treatment Status   Post Treatment Patient Status Patient sitting in bedside chair or w/c   Support Present Post Treatment  Clinical assistant present   Plan of Care Review   Plan Of Care Reviewed With patient   Physical Therapy Clinical Impression   Assessment Patient is safe and independent with gait at this time. She has reached PT goals and is discharged from PT at this time.

## 2022-12-31 DIAGNOSIS — F4320 Adjustment disorder, unspecified: Secondary | ICD-10-CM

## 2022-12-31 LAB — POC BLOOD GLUCOSE (RESULTS): GLUCOSE, POC: 135 mg/dl — ABNORMAL HIGH (ref 60–110)

## 2022-12-31 MED ORDER — FLUOXETINE 40 MG CAPSULE
40.0000 mg | ORAL_CAPSULE | Freq: Every day | ORAL | 0 refills | Status: DC
Start: 2023-01-01 — End: 2023-01-28

## 2022-12-31 MED ORDER — MIRTAZAPINE 7.5 MG TABLET
7.5000 mg | ORAL_TABLET | Freq: Every evening | ORAL | 0 refills | Status: DC
Start: 2022-12-31 — End: 2023-01-28

## 2022-12-31 MED ORDER — NICOTINE 21 MG/24 HR DAILY TRANSDERMAL PATCH
21.0000 mg | MEDICATED_PATCH | Freq: Every day | TRANSDERMAL | 0 refills | Status: DC
Start: 2023-01-01 — End: 2023-01-20

## 2022-12-31 NOTE — Care Plan (Signed)
[name] has been out in the day room. She is social with peers and staff. She has been calm and cooperative while on the unit. Affect is flat. My states that she slept "good" last night. She denies any depression or anxiety this morning. She denies any suicidal or homicidal thoughts. Denies any hallucinations or paranoid thoughts. She states that she is safe on the unit and states that she would be safe to return home. She has been compliant with medications and denies any side effects.   Problem: Adult Behavioral Health Plan of Care  Goal: Patient-Specific Goal (Individualization)  Outcome: Ongoing (see interventions/notes)

## 2022-12-31 NOTE — Group Note (Signed)
Group topic:  GOALS/AFFIRMATIONS GROUP    Date of group:  12/31/2022  Start time of group:  0730  End time of group:  0800    Attend: [x]   Not attend: []   Attendance:  inpatient attended all of group               Summary of group discussion:   Patients were asked about sleep, dep/anx, si/hi/avh, safety, and goal for the day. Following are their responses:      RHYLIN ANSON  is a 72 y.o. female participating in a goal group.    Patient's mental status/affect:  appropriate / appropriate    Patient's behavior:  social with staff and peers    Patient's response:  Pt slept good denies dep/anx, si/hi/avh and contracts Goal is to work on Brewing technologist S. Kenton Kingfisher, [place]  12/31/2022, 08:33

## 2022-12-31 NOTE — Care Plan (Signed)
Pt. Is currently resting in bed with eyes closed, respirations regular. Will continue to monitor.   Problem: Adult Behavioral Health Plan of Care  Goal: Patient-Specific Goal (Individualization)  Outcome: Ongoing (see interventions/notes)

## 2022-12-31 NOTE — Behavioral Health (Signed)
Explained IMM form to pt. Pt verbalized understanding and signed document. Pt received copy and original was placed in chart. Completed safety plan with pt, which will be printed with AVS.    Lucious Groves, LPC

## 2022-12-31 NOTE — Nurses Notes (Signed)
Patient discharged at this time. Picked up by her husband. Discharge instructions reviewed with patient. Patient expressed understanding. Patient states that she is safe to return home. Patient refused to call smoking cessation number.

## 2022-12-31 NOTE — Discharge Instructions (Addendum)
Keep all follow up appointments. Take all medications as prescribed.

## 2022-12-31 NOTE — Behavioral Health (Signed)
Pt is resting in bed. She slept all night

## 2022-12-31 NOTE — Care Plan (Signed)
Faxed demos and recent dr note to [facility] requesting therapy appointment. Received fax confirmation.    Lucious Groves, [place]

## 2022-12-31 NOTE — Discharge Summary (Addendum)
Telemedicine Documentation: This is a telemedicine note. Patient was treated using telemedicine, real time audio and video  Patient Location: [facility]  Provider Location: [facility]  Patient/family aware of provider location: yes  Patient/family consent for telemedicine: yes  Examination observed and performed by: Aram Beecham provider: Ara Kussmaul, MD    [facility]  DISCHARGE SUMMARY    PATIENT NAME:  [name], [name]  MRN:  [id number]  DOB:  1951/01/09    ENCOUNTER DATE:  12/26/2022  INPATIENT ADMISSION DATE: 12/26/2022  DISCHARGE DATE:  12/31/2022    ATTENDING PHYSICIAN: Denny Levy, MD  SERVICE: [facility] BEH MED  PRIMARY CARE PHYSICIAN: No Pcp       No lay caregiver identified.    PRIMARY DISCHARGE DIAGNOSIS:   Adjustment Disorder  MDD recurrent, severe without psychosis    Active Hospital Problems    Diagnosis Date Noted    Principal Problem: Depression with suicidal ideation [F32.A, R45.851] 12/26/2022    Hypothyroidism [E03.9] 12/27/2022    Anxiety [F41.9] 12/27/2022    DM2 (diabetes mellitus, type 2) (CMS HCC) [E11.9] 12/27/2022    HTN (hypertension) [I10] 12/27/2022    HLD (hyperlipidemia) [E78.5] 12/27/2022    Glaucoma [H40.9] 12/27/2022    Bipolar disorder (CMS HCC) [F31.9] 12/27/2022      Resolved Hospital Problems   No resolved problems to display.     There are no active non-hospital problems to display for this patient.          Current Discharge Medication List        START taking these medications.        Details   Mirtazapine 7.5 mg Tablet  Commonly known as: REMERON   7.5 mg, Oral, NIGHTLY  Qty: 30 Tablet  Refills: 0     nicotine 21 mg/24 hr Patch 24 hr  Commonly known as: NICODERM CQ  Start taking on: January 01, 2023   21 mg, Transdermal, DAILY  Qty: 30 Patch  Refills: 0            CONTINUE these medications which have CHANGED during your visit.        Details   FLUoxetine 40 mg Capsule  Commonly known as: PROzac  Start taking on: January 01, 2023  What changed:   medication  strength  how much to take   40 mg, Oral, DAILY  Qty: 30 Capsule  Refills: 0            CONTINUE these medications - NO CHANGES were made during your visit.        Details   ALPRAZolam 0.5 mg Tablet  Commonly known as: XANAX   0.5 mg, Oral, 3 TIMES DAILY PRN  Refills: 0     aspirin 81 mg Tablet, Delayed Release (E.C.)  Commonly known as: ECOTRIN   81 mg, Oral, DAILY  Refills: 0     glipiZIDE 2.5 mg Tablet   2.5 mg, Oral, 2 TIMES DAILY BEFORE MEALS, Take 30 minutes before meals  Refills: 0     latanoprost 0.005 % Drops  Commonly known as: XALATAN   1 Drop, Both Eyes, EVERY EVENING  Refills: 0     levothyroxine 25 mcg Tablet  Commonly known as: SYNTHROID   25 mcg, Oral, EVERY MORNING  Refills: 0     lisinopriL 2.5 mg Tablet  Commonly known as: PRINIVIL   2.5 mg, Oral, DAILY  Refills: 0     multivitamin with folic acid 400 mcg Tablet  Commonly known as: THERA  1 Tablet, Oral, DAILY  Refills: 0     rosuvastatin 20 mg Tablet  Commonly known as: CRESTOR   20 mg, Oral, EVERY EVENING  Refills: 0            STOP taking these medications.      CALTRATE 600 + D ORAL     gabapentin 300 mg Capsule  Commonly known as: NEURONTIN     HYDROcodone-acetaminophen 5-325 mg Tablet  Commonly known as: NORCO     traMADoL 50 mg Tablet  Commonly known as: ULTRAM            Discharge med list refreshed?  YES     No Known Allergies  HOSPITAL PROCEDURE(S):   No orders of the defined types were placed in this encounter.      REASON FOR HOSPITALIZATION AND HOSPITAL COURSE   BRIEF HPI:  ANALUISA BURKER is a 72 y.o. female who was admitted By DR Aquilla Hacker: Patient was admitted for suicide attempt by overdose on Xanax and Benadryl and patient has a lot of conflict with family specifically her son.  Patient was on fluoxetine 80 mg but she never took the dose 80 mg and only she took 20 mg daily  March 16:  Patient started on fluoxetine 20 mg and alprazolam 0.5 t.i.d. p.r.n.  March 17:  Patient is irritable.  Patient denies suicidal thoughts and  patient will start on fluoxetine 40 mg starting tomorrow     I assumed care of pt on 3/18: she reports doing very well currently- denies depression/ anxiety and is remorseful for her attempt. She does blame her family. Tolerating meds- no changes. She has a H/O winter worsening.   3/19: reports that she called family and expressed remorse for suicidal thoughts. Doing well. No changes.       MENTAL STATUS EXAMINATION  Setting: in private room  Appearance:  appears stated age in street clothes appropriate grooming  Level of Consciousness:  Alert   Behavior: Pleasant, cooperative  Eye contact: good eye contact  Gait: Not tested  Psychomotor: no abnormal movements and no agitation or retardation   Speech:  normal rate, volume, rhythm, tone, and amount  Thought Process:  linear, goal directed  Thought Content:  no suicidal or homicidal ideation   Perception: no auditory or visual hallucinations  Affect: euthymic with normal range, reactivity is present, congruent with mood, appropriate to thought content, and no lability is noted  Mood:   "feel great"  Orientation: fully oriented to person, place, time, and situation   Attention:   intact and sustained  Concentration:  conversationally appropriate  Memory:  intact for recent, remote, and autobiographical information  Language:   fluent  Fund of Knowledge:  adequate  Abstraction:  able to abstract  Insight:  has intellectual insight but lacks emotional insight  Judgment:  fair  Impulse Control:  fair  ----------------------------------------------------------------------------------  COLUMBIA-SUICIDE SEVERITY RATING SCALE (C-SSRS)                                 RISK ASSESSMENT     Suicidal and Self-Injurious Behavior in last 3 months  Actual suicide attempt     Suicidal and Self-Injurious Behavior in LIFETIME   Actual suicide attempt     Suicidal Ideation (Check Most Severe in Past Month)  Suicidal thoughts with method (but without specific plan or intent to act)      Activating Events (Recent)  Recent loss(es) or other significant negative event(s) (legal, financial, relationship, etc.)  DESCRIBE: moved from [facility]     Treatment History  Previous psychiatric diagnoses and treatments     Other Risk Factors  N/A     Clinical Status (Recent)  Other: adjustment     Protective Factors (Recent)  Identifies reasons for living  Responsibility to family or others; living with family  Supportive social network or family     Other Protective Factors:    Believably reports no overpowering urge to hurt self or others  Not feeling like such a burden to others that death would be a relief to them  Can maintain or regain composure while talking about the acute precipitants  Acknowledges and is motivated to cope with life stressors  Would not want one's dangerous behavior to hurt others  Symptoms known to be risk factors diminish during intervention  Makes progress resolving the crisis  Engages constructively with treatment staff  Shows interest in non-inpatient treatment  Presented voluntarily seeking help        SUICIDAL RISK ASSESSMENT: MODERATE    TRANSITION/POST DISCHARGE CARE/PENDING TESTS/REFERRALS: NONE    CONDITION ON DISCHARGE:  A. Ambulation: Full ambulation  B. Self-care Ability: Complete  C. Cognitive Status Alert and Oriented x 3  D. Code status at discharge: FULL      LINES/DRAINS/WOUNDS AT DISCHARGE:   Patient Lines/Drains/Airways Status       Active Line / Dialysis Catheter / Dialysis Graft / Drain / Airway / Wound       None                    DISCHARGE DISPOSITION:  Home discharge  DISCHARGE INSTRUCTIONS:  Post-Discharge Follow Up Appointments       Tuesday Jan 06, 2023    Go to [facility]    Where: [address]  [place], [place]  [phone]      Monday Jan 12, 2023    Go to Dr. Denny Levy    Where: [address]  [place], [place], [address]  [phone]          No discharge procedures on file.       Ara Kussmaul, MD    Copies sent to Care Team          Relationship Specialty Notifications Start End    Pcp, No PCP - General   12/26/22             Referring providers can utilize [email or web address] to access their referred [facility] Medicine patient's information.

## 2022-12-31 NOTE — Behavioral Health (Signed)
Completed family session with pt's husband, [name]. I informed him that [name] will be discharging today. He stated that she sounds like she is doing much better, and that "I need her home". [name] agreed to help manage and dispense [name]'s medications to her in order to decrease the chance of another future overdose. I went over [name]'s follow up with him. [name] was requesting that [name] be schedule with a PCP that is affiliated with [facility], and I assured him that I would let nursing staff know this. [name] stated that he will be able to pick [name] up once her discharge is complete. He denies having any questions or concerns regarding [name]'s discharge.    Lucious Groves, [place]

## 2023-01-02 ENCOUNTER — Ambulatory Visit (INDEPENDENT_AMBULATORY_CARE_PROVIDER_SITE_OTHER): Payer: Self-pay | Admitting: NURSE PRACTITIONER

## 2023-01-02 NOTE — Behavioral Health (Signed)
Faxed discharge summary to [facility]. Received fax confirmation.    Roddrick Sharron, LPC

## 2023-01-12 ENCOUNTER — Encounter (INDEPENDENT_AMBULATORY_CARE_PROVIDER_SITE_OTHER): Payer: Self-pay | Admitting: PSYCHIATRY

## 2023-01-12 ENCOUNTER — Telehealth (INDEPENDENT_AMBULATORY_CARE_PROVIDER_SITE_OTHER): Payer: Self-pay | Admitting: PSYCHIATRY

## 2023-01-12 NOTE — Telephone Encounter (Signed)
Okay very good

## 2023-01-12 NOTE — Telephone Encounter (Signed)
Patient cancelled her appt with you today she has the flu.  This is her first no show/cancel.

## 2023-01-15 ENCOUNTER — Ambulatory Visit (HOSPITAL_BASED_OUTPATIENT_CLINIC_OR_DEPARTMENT_OTHER): Payer: Self-pay | Admitting: Medical

## 2023-01-20 ENCOUNTER — Ambulatory Visit: Payer: Medicare PPO | Attending: Medical | Admitting: Medical

## 2023-01-20 ENCOUNTER — Telehealth (HOSPITAL_BASED_OUTPATIENT_CLINIC_OR_DEPARTMENT_OTHER): Payer: Self-pay | Admitting: Medical

## 2023-01-20 ENCOUNTER — Encounter (HOSPITAL_BASED_OUTPATIENT_CLINIC_OR_DEPARTMENT_OTHER): Payer: Self-pay | Admitting: Medical

## 2023-01-20 ENCOUNTER — Other Ambulatory Visit (HOSPITAL_BASED_OUTPATIENT_CLINIC_OR_DEPARTMENT_OTHER): Payer: Medicare PPO

## 2023-01-20 ENCOUNTER — Other Ambulatory Visit: Payer: Self-pay

## 2023-01-20 VITALS — BP 134/88 | HR 125 | Temp 98.3°F | Ht 60.0 in | Wt 214.0 lb

## 2023-01-20 DIAGNOSIS — E1165 Type 2 diabetes mellitus with hyperglycemia: Secondary | ICD-10-CM | POA: Insufficient documentation

## 2023-01-20 DIAGNOSIS — M545 Low back pain, unspecified: Secondary | ICD-10-CM | POA: Insufficient documentation

## 2023-01-20 DIAGNOSIS — Z0000 Encounter for general adult medical examination without abnormal findings: Secondary | ICD-10-CM

## 2023-01-20 DIAGNOSIS — E782 Mixed hyperlipidemia: Secondary | ICD-10-CM

## 2023-01-20 DIAGNOSIS — D649 Anemia, unspecified: Secondary | ICD-10-CM

## 2023-01-20 DIAGNOSIS — E039 Hypothyroidism, unspecified: Secondary | ICD-10-CM | POA: Insufficient documentation

## 2023-01-20 DIAGNOSIS — Z9151 Personal history of suicidal behavior: Secondary | ICD-10-CM | POA: Insufficient documentation

## 2023-01-20 DIAGNOSIS — I10 Essential (primary) hypertension: Secondary | ICD-10-CM

## 2023-01-20 DIAGNOSIS — F411 Generalized anxiety disorder: Secondary | ICD-10-CM | POA: Insufficient documentation

## 2023-01-20 DIAGNOSIS — Z1159 Encounter for screening for other viral diseases: Secondary | ICD-10-CM

## 2023-01-20 DIAGNOSIS — G8929 Other chronic pain: Secondary | ICD-10-CM | POA: Insufficient documentation

## 2023-01-20 DIAGNOSIS — F319 Bipolar disorder, unspecified: Secondary | ICD-10-CM | POA: Insufficient documentation

## 2023-01-20 LAB — COMPREHENSIVE METABOLIC PNL, FASTING
ALBUMIN: 3.1 g/dL — ABNORMAL LOW (ref 3.4–4.8)
ALKALINE PHOSPHATASE: 59 U/L (ref 55–145)
ALT (SGPT): 13 U/L (ref 8–22)
ANION GAP: 10 mmol/L (ref 4–13)
AST (SGOT): 22 U/L (ref 8–45)
BILIRUBIN TOTAL: 0.4 mg/dL (ref 0.3–1.3)
BUN/CREA RATIO: 13 (ref 6–22)
BUN: 12 mg/dL (ref 8–25)
CALCIUM: 9.8 mg/dL (ref 8.6–10.3)
CHLORIDE: 104 mmol/L (ref 96–111)
CO2 TOTAL: 25 mmol/L (ref 23–31)
CREATININE: 0.91 mg/dL (ref 0.60–1.05)
ESTIMATED GFR - FEMALE: 68 mL/min/BSA (ref >=60)
GLUCOSE: 120 mg/dL — ABNORMAL HIGH (ref 70–99)
POTASSIUM: 4.1 mmol/L (ref 3.5–5.1)
PROTEIN TOTAL: 7.7 g/dL (ref 6.0–8.0)
SODIUM: 139 mmol/L (ref 136–145)

## 2023-01-20 LAB — MICROALBUMIN/CREATININE RATIO, URINE, RANDOM
CREATININE RANDOM URINE: 118 mg/dL
MICROALBUMIN RANDOM URINE: 1.7 mg/dL
MICROALBUMIN/CREATININE RATIO RANDOM URINE: 14.4 mg/g (ref <30.0)

## 2023-01-20 LAB — IRON TRANSFERRIN AND TIBC
IRON (TRANSFERRIN) SATURATION: 16 % (ref 15–50)
IRON: 39 ug/dL — ABNORMAL LOW (ref 45–170)
TOTAL IRON BINDING CAPACITY: 244 ug/dL (ref 224–476)
TRANSFERRIN: 174 mg/dL (ref 160–340)

## 2023-01-20 LAB — CBC WITH DIFF
BASOPHIL #: 0 10*3/uL (ref 0.00–0.30)
BASOPHIL %: 0 % (ref 0–1)
EOSINOPHIL #: 0.1 10*3/uL (ref 0.00–0.50)
EOSINOPHIL %: 1 % (ref 0–4)
HCT: 37.9 % (ref 37.0–47.0)
HGB: 12.2 g/dL — ABNORMAL LOW (ref 12.5–16.0)
LYMPHOCYTE #: 2.9 10*3/uL (ref 0.90–4.80)
LYMPHOCYTE %: 27 % (ref 23–35)
MCH: 27 pg — ABNORMAL LOW (ref 28.0–33.0)
MCHC: 32.3 g/dL (ref 32.0–37.0)
MCV: 83.6 fL (ref 78.0–100.0)
MONOCYTE #: 1.1 10*3/uL — ABNORMAL HIGH (ref 0.30–0.90)
MONOCYTE %: 10 % (ref 0–12)
NEUTROPHIL #: 6.8 10*3/uL (ref 1.70–7.00)
NEUTROPHIL %: 62 % (ref 50–70)
PLATELETS: 320 10*3/uL (ref 130–400)
RBC: 4.53 10*6/uL (ref 4.20–5.40)
RDW: 15.8 % (ref 9.9–16.5)
WBC: 11 10*3/uL (ref 4.5–11.0)

## 2023-01-20 LAB — URINALYSIS, MACROSCOPIC
BILIRUBIN: NEGATIVE mg/dL
BLOOD: NEGATIVE mg/dL
GLUCOSE: NEGATIVE mg/dL
KETONES: NEGATIVE mg/dL
LEUKOCYTES: NEGATIVE WBCs/uL
NITRITE: NEGATIVE
PH: 6 (ref 5.0–8.0)
PROTEIN: NEGATIVE mg/dL
SPECIFIC GRAVITY: 1.011 (ref 1.002–1.030)
UROBILINOGEN: <2 mg/dL (ref <2.0)

## 2023-01-20 LAB — LIPID PANEL
CHOL/HDL RATIO: 3.1
CHOLESTEROL: 125 mg/dL (ref 100–200)
HDL CHOL: 40 mg/dL — ABNORMAL LOW (ref >=50)
LDL CALC: 66 mg/dL (ref <100)
NON-HDL: 85 mg/dL (ref <=190)
TRIGLYCERIDES: 102 mg/dL (ref <150)
VLDL CALC: 15 mg/dL (ref <30)

## 2023-01-20 LAB — MAGNESIUM: MAGNESIUM: 1.9 mg/dL (ref 1.8–2.6)

## 2023-01-20 LAB — VITAMIN B12: VITAMIN B 12: 1218 pg/mL — ABNORMAL HIGH (ref 200–900)

## 2023-01-20 LAB — URINALYSIS, MICROSCOPIC

## 2023-01-20 LAB — HGA1C (HEMOGLOBIN A1C WITH EST AVG GLUCOSE)
ESTIMATED AVERAGE GLUCOSE: 134 mg/dL
HEMOGLOBIN A1C: 6.3 % — ABNORMAL HIGH (ref <=5.7)

## 2023-01-20 LAB — FERRITIN: FERRITIN: 663 ng/mL — ABNORMAL HIGH (ref 5–200)

## 2023-01-20 LAB — HEPATITIS C ANTIBODY SCREEN WITH REFLEX TO HCV PCR: HCV ANTIBODY QUALITATIVE: NEGATIVE

## 2023-01-20 LAB — THYROID STIMULATING HORMONE WITH FREE T4 REFLEX: TSH: 1.357 u[IU]/mL (ref 0.350–4.940)

## 2023-01-20 NOTE — Progress Notes (Signed)
[facility], [facility]  [address]  [place] [id number]    Patient: Kristin KaufmannBarbara J Lawson  MRN: [id number]  DOB: 08/12/1951  Today's Date: 01/20/23      Kristin KaufmannBarbara J Lawson is a 72 y.o. female who presents today for New Patient      Vitals:    01/20/23 1439   BP: 134/88   Pulse: (!) 125   Temp: 36.8 C (98.3 F)   TempSrc: Tympanic   SpO2: 94%   Weight: 97.1 kg (214 lb)   Height: 1.524 m (5')   PainSc:   7   PainLoc: Back       HPI : She is here today as a new patient. [place] to help her son.   Her last eye exam was over a year ago.   She has full dentures.   She was diagnosed with thyroid disease 10 years ago.  Her glucose has been 98-100. She was diagnosed with diabetes a year ago.  She was on Ozempic and Mounjaro, but it caused explosive diarrhea. Metformin caused hypoglycemia. She gets hypoglycemia frequently. She carries glucose gel packets with her. She states her last A1C was 6.7%. She has not had an appetite since she had the flu a couple of weeks ago.    Her BP is normally 116/50. She states her pulse is always high when she is at the doctors.   She was seeing a pain management doctor for her chronic lower back pain. She has had pain since her surgery in 2012 at [facility]. She has Hydrocodone from her old doctor. She takes this PRN for severe pain. Her psychsiatrist discontinued her Gabapentin and Tramadol. She has never been prescribed NSAID'S for her pain. Swimming helps with her back pain. She tries to walk, but this "puts her down". She "just wants to be able to walk again". She had a lumbar spine MRI last year. She would like to see pain management again.   She had an injection that made her sugar go up to 300 and raised her BP.   She is up to date on her flu and pneumonia vaccines. She has never had a shingles or tetanus vaccine.  She had an echo in the past year.  She was hospitalized in March due to a suicide attempt by overdosing on  Xanax and Benadryl. She was having family conflict with her son. She was on Prozac 80mg , but she only took 20mg  daily. She was started on Prozac 20mg  daily and Xanax 0.5mg  TID PRN. Prozac was increased to 40mg  daily. She was discharged to home and scheduled for a follow up with psychiatry.   She is up to date on her mammogram and dexa. She has never had a colonoscopy. She had a negative Cologuard recently.     Admission on 12/26/2022, Discharged on 12/31/2022   Component Date Value Ref Range Status    ACETAMINOPHEN LEVEL  12/26/2022 <5 (L)  10 - 30 ug/mL  Final    Potential Acetaminophen hepatoxicity:                                                      4 hours post-ingestion: >150 ug/mL  8 hours post-ingestion: >75 ug/mL                                                12 hours post-ingestion: >40 ug/mL    SODIUM 12/26/2022 140  136 - 145 mmol/L Final    POTASSIUM 12/26/2022 3.7  3.5 - 5.1 mmol/L Final    CHLORIDE 12/26/2022 107  96 - 111 mmol/L Final    CO2 TOTAL 12/26/2022 24  23 - 31 mmol/L Final    ANION GAP 12/26/2022 9  4 - 13 mmol/L Final    BUN 12/26/2022 10  8 - 25 mg/dL Final    CREATININE 08/09/2535 0.91  0.60 - 1.05 mg/dL Final    BUN/CREA RATIO 12/26/2022 11  6 - 22 Final    ALBUMIN 12/26/2022 3.2 (L)  3.4 - 4.8 g/dL  Final    CALCIUM 64/40/3474 9.7  8.6 - 10.3 mg/dL Final    Gadolinium-containing contrast can interfere with calcium measurement.    GLUCOSE 12/26/2022 123  65 - 125 mg/dL Final    ALKALINE PHOSPHATASE 12/26/2022 49 (L)  55 - 145 U/L Final    ALT (SGPT) 12/26/2022 11  8 - 22 U/L Final    AST (SGOT)  12/26/2022 18  8 - 45 U/L Final    BILIRUBIN TOTAL 12/26/2022 0.3  0.3 - 1.3 mg/dL Final    Naproxen therapy can falsely elevate total bilirubin levels.    PROTEIN TOTAL 12/26/2022 7.3  6.0 - 8.0 g/dL Final    ESTIMATED GFR - FEMALE 12/26/2022 68  >=60 mL/min/BSA Final    Estimated Glomerular Filtration Rate (eGFR) is calculated using the  gender-dependent CKD-EPI (2021) equation, intended for patients 38 years of age and older.    If patient sex is not documented, or if patient sex and gender are incongruent, eGFR results calculated for both female and female will be reported. Recommend correlation and careful interpretation of patient history, keeping in mind that serum creatinine is influenced by hormone therapy.    Stage, GFR, Classification   G1, 90, Normal or High   G2, 60-89, Mildly decreased   G3a, 45-59, Mildly to moderately decreased   G3b, 30-44, Moderately to severely decreased   G4, 15-29, Severely decreased   G5, <15, Kidney failure   In the absence of kidney damage, neither G1 or G2 fulfill criteria for CKD per KDIGO.      SALICYLATE LEVEL 12/26/2022 <5 (L)  15 - 30 mg/dL Final    Stated range is therapeutic. Salicylate concentrations >70 mg/dL can be lethal.    PROTHROMBIN TIME 12/26/2022 13.0  9.5 - 13.4 seconds Final    INR 12/26/2022 1.13  0.83 - 1.16 Final    AMPHETAMINES, URINE 12/26/2022 Negative  Negative Final    Decision Level 500 ng/mL      Drug screening results are intended for medical use only and are presumptive/unconfirmed. When unexpected results are obtained, consider definitive testing if it has not been ordered.    This method does not cross-react with ritalinic acid.      BARBITURATES URINE 12/26/2022 Negative  Negative Final    Decision Level 200 ng/mL      Drug screening results are intended for medical use only and are presumptive/unconfirmed. When unexpected results are obtained, consider definitive testing if it has not been ordered.  BENZODIAZEPINES URINE 12/26/2022 Positive (A)  Negative Final    Decision Level 200 ng/mL      Drug screening results are intended for medical use only and are presumptive/unconfirmed. When unexpected results are obtained, consider definitive testing if it has not been ordered.      BUPRENORPHINE URINE 12/26/2022 Negative  Negative Final    Decision Level 5 ng/mL      Drug  screening results are intended for medical use only and are presumptive/unconfirmed. When unexpected results are obtained, consider definitive testing if it has not been ordered.      CANNABINOIDS URINE 12/26/2022 Negative  Negative Final    Decision Level 50 ng/mL      Drug screening results are intended for medical use only and are presumptive/unconfirmed. When unexpected results are obtained, consider definitive testing if it has not been ordered.      COCAINE METABOLITES URINE 12/26/2022 Negative  Negative Final    Decision Level 150 ng/mL      Drug screening results are intended for medical use only and are presumptive/unconfirmed. When unexpected results are obtained, consider definitive testing if it has not been ordered.      METHADONE URINE 12/26/2022 Negative  Negative Final    Decision Level 300 ng/mL      Drug screening results are intended for medical use only and are presumptive/unconfirmed. When unexpected results are obtained, consider definitive testing if it has not been ordered.      OPIATES URINE (LOW CUTOFF) 12/26/2022 Negative  Negative Final    Decision Level 300 ng/mL      Drug screening results are intended for medical use only and are presumptive/unconfirmed. When unexpected results are obtained, consider definitive testing if it has not been ordered.    This method does not cross-react significantly with semisynthetic and synthetic opioids present in therapeutic concentrations.      OXYCODONE URINE 12/26/2022 Negative  Negative Final    Decision Level 100 ng/mL      Drug screening results are intended for medical use only and are presumptive/unconfirmed. When unexpected results are obtained, consider definitive testing if it has not been ordered.    This method does not cross-react significantly with natural opiates present in therapeutic concentrations.      ECSTASY/MDMA URINE 12/26/2022 Negative  Negative Final    Decision Level 500 ng/mL      Drug screening results are intended for  medical use only and are presumptive/unconfirmed. When unexpected results are obtained, consider definitive testing if it has not been ordered.      FENTANYL, RANDOM URINE 12/26/2022 Negative  Negative Final    Decision Level 1.0 ng/mL      Drug screening results are intended for medical use only and are presumptive/unconfirmed. When unexpected results are obtained, consider definitive testing if it has not been ordered.      CREATININE RANDOM URINE 12/26/2022 63  50 - 100 mg/dL Final    Ventricular rate 12/26/2022 83  BPM Final    Atrial Rate 12/26/2022 83  BPM Final    PR Interval 12/26/2022 138  ms Final    QRS Duration 12/26/2022 78  ms Final    QT Interval 12/26/2022 386  ms Final    QTC Calculation 12/26/2022 453  ms Final    Calculated P Axis 12/26/2022 71  degrees Final    Calculated R Axis 12/26/2022 29  degrees Final    Calculated T Axis 12/26/2022 39  degrees Final    SARS-CoV-2 12/26/2022  Not Detected  Not Detected Final    INFLUENZA VIRUS TYPE A 12/26/2022 Not Detected  Not Detected Final    INFLUENZA VIRUS TYPE B 12/26/2022 Not Detected  Not Detected Final    RESPIRATORY SYNCTIAL VIRUS (RSV) 12/26/2022 Not Detected  Not Detected Final    WBC 12/26/2022 9.0  4.5 - 11.0 x10^3/uL Final    RBC 12/26/2022 4.66  4.20 - 5.40 x10^6/uL Final    HGB 12/26/2022 13.3  12.5 - 16.0 g/dL Final    HCT 81/19/1478 38.6  37.0 - 47.0 % Final    MCV 12/26/2022 82.9  78.0 - 100.0 fL Final    MCH 12/26/2022 28.5  28.0 - 33.0 pg Final    MCHC 12/26/2022 34.4  32.0 - 37.0 g/dL Final    RDW 29/56/2130 16.1  9.9 - 16.5 % Final    PLATELETS 12/26/2022 236  130 - 400 x10^3/uL Final    NEUTROPHIL % 12/26/2022 45 (L)  50 - 70 % Final    LYMPHOCYTE % 12/26/2022 42 (H)  23 - 35 % Final    MONOCYTE % 12/26/2022 10  0 - 12 % Final    EOSINOPHIL % 12/26/2022 1  0 - 4 % Final    BASOPHIL % 12/26/2022 1  0 - 1 % Final    NEUTROPHIL # 12/26/2022 4.00  1.70 - 7.00 x10^3/uL Final    LYMPHOCYTE # 12/26/2022 3.80  0.90 - 4.80 x10^3/uL Final     MONOCYTE # 12/26/2022 0.90  0.30 - 0.90 x10^3/uL Final    EOSINOPHIL # 12/26/2022 0.10  0.00 - 0.50 x10^3/uL Final    BASOPHIL # 12/26/2022 0.10  0.00 - 0.30 x10^3/uL Final    GLUCOSE, POC 12/27/2022 96  60 - 110 mg/dl Final    GLUCOSE, POC 86/57/8469 91  60 - 110 mg/dl Final    GLUCOSE, POC 62/95/2841 124 (H)  60 - 110 mg/dl Final    GLUCOSE, POC 32/44/0102 108  60 - 110 mg/dl Final    GLUCOSE, POC 72/53/6644 135 (H)  60 - 110 mg/dl Final     XR CHEST AP 03/47/4259    Narrative  Marylu J Flammia    RADIOLOGIST: Arlys John Schambach    XR CHEST AP performed on 12/26/2022 7:43 PM    CLINICAL HISTORY: aspiation drug ingestion.  intention drug overdose per diagnosis    TECHNIQUE: Frontal view of the chest.    COMPARISON:  None    FINDINGS:    The heart size is normal.  There are chronic-appearing changes of both lungs.    Impression  NO ACUTE FINDINGS.        Radiologist location ID: [id number]      Past Medical History  Current Outpatient Medications   Medication Sig    ALPRAZolam (XANAX) 0.5 mg Oral Tablet Take 1 Tablet (0.5 mg total) by mouth Three times a day as needed for Insomnia or Anxiety    aspirin (ECOTRIN) 81 mg Oral Tablet, Delayed Release (E.C.) Take 1 Tablet (81 mg total) by mouth Once a day    calcium carbonate/vitamin D3 (CALTRATE WITH VITAMIN D3 ORAL) Take by mouth    FLUoxetine (PROZAC) 40 mg Oral Capsule Take 1 Capsule (40 mg total) by mouth Once a day for 30 days    glipiZIDE 2.5 mg Oral Tablet Take 1 Tablet (2.5 mg total) by mouth Twice a day before meals Take 30 minutes before meals    latanoprost (XALATAN) 0.005 % Ophthalmic Drops Instill 1 Drop into both  eyes Every evening    levothyroxine (SYNTHROID) 25 mcg Oral Tablet Take 1 Tablet (25 mcg total) by mouth Every morning    lisinopriL (PRINIVIL) 2.5 mg Oral Tablet Take 1 Tablet (2.5 mg total) by mouth Once a day    mirtazapine (REMERON) 7.5 mg Oral Tablet Take 1 Tablet (7.5 mg total) by mouth Every night for 30 days    multivitamin with folic acid  (THERA) 400 mcg Oral Tablet Take 1 Tablet by mouth Once a day    rosuvastatin (CRESTOR) 20 mg Oral Tablet Take 1 Tablet (20 mg total) by mouth Every evening     Allergies   Allergen Reactions    Morphine Hives/ Urticaria    Oxycodone Hives/ Urticaria     Past Medical History:   Diagnosis Date    Bipolar disorder (CMS HCC)     COPD (chronic obstructive pulmonary disease) (CMS HCC)     Diabetes mellitus, type 2 (CMS HCC)     DJD (degenerative joint disease), lumbar     Full dentures     Generalized anxiety disorder     HLD (hyperlipidemia)     HTN (hypertension)     Hypothyroidism          Past Surgical History:   Procedure Laterality Date    HX BACK SURGERY  2012    LUMBAR SPINE    HX CESAREAN SECTION      x3    HX CHOLECYSTECTOMY      HX HYSTERECTOMY       Family Medical History:       Problem Relation (Age of Onset)    Alcohol abuse Sister    Heart Disease Maternal Grandmother, Maternal Grandfather    Uterine Cancer Mother            Social History     Socioeconomic History    Marital status: Married   Tobacco Use    Smoking status: Former     Average packs/day: 1.5 packs/day for 30.7 years (46.0 ttl pk-yrs)     Types: Cigarettes     Start date: 1993     Passive exposure: Current    Smokeless tobacco: Never   Substance and Sexual Activity    Alcohol use: Not Currently    Drug use: Never    Sexual activity: Not Currently     Partners: Male     Social Determinants of Health     Financial Resource Strain: Low Risk  (12/26/2022)    Financial Resource Strain     SDOH Financial: No   Transportation Needs: Low Risk  (12/26/2022)    Transportation Needs     SDOH Transportation: No   Social Connections: Medium Risk (12/26/2022)    Social Connections     SDOH Social Isolation: 3 to 5 times a week   Housing Stability: Low Risk  (12/26/2022)    Housing Stability     SDOH Housing Situation: I have housing.     SDOH Housing Worry: No         Review of Systems  Review of Systems   Constitutional:  Positive for appetite change  (DECREASED).   HENT:  Positive for congestion and postnasal drip.    Respiratory:  Positive for cough (PROD-WHITE).    Musculoskeletal:  Positive for back pain.   All other systems reviewed and are negative.       Physical Exam  Physical Exam  Vitals and nursing note reviewed.   Constitutional:  Appearance: She is obese.   HENT:      Head: Normocephalic and atraumatic.      Right Ear: Tympanic membrane, ear canal and external ear normal.      Left Ear: Tympanic membrane, ear canal and external ear normal.      Mouth/Throat:      Lips: Pink.      Mouth: Mucous membranes are moist.      Pharynx: Oropharynx is clear. Uvula midline.   Eyes:      Conjunctiva/sclera: Conjunctivae normal.      Pupils: Pupils are equal, round, and reactive to light.   Neck:      Thyroid: No thyromegaly.      Vascular: No JVD.      Trachea: No tracheal deviation.   Cardiovascular:      Rate and Rhythm: Normal rate and regular rhythm.      Pulses: Normal pulses.      Heart sounds: Normal heart sounds. No murmur heard.  Pulmonary:      Effort: Pulmonary effort is normal.      Breath sounds: Normal breath sounds.   Abdominal:      General: Bowel sounds are normal.      Palpations: Abdomen is soft.      Tenderness: There is no abdominal tenderness.   Musculoskeletal:      Cervical back: Neck supple.      Lumbar back: Tenderness present. No swelling, edema, deformity, signs of trauma, lacerations, spasms or bony tenderness. Decreased range of motion. No scoliosis.   Feet:      Right foot:      Skin integrity: Dry skin present.      Left foot:      Skin integrity: Dry skin present.   Lymphadenopathy:      Cervical: No cervical adenopathy.   Skin:     General: Skin is warm and dry.   Neurological:      General: No focal deficit present.      Mental Status: She is alert and oriented to person, place, and time. Mental status is at baseline.      Motor: Tremor (HEAD) present.      Gait: Gait is intact.      Deep Tendon Reflexes:      Reflex  Scores:       Brachioradialis reflexes are 2+ on the right side and 2+ on the left side.       Patellar reflexes are 0 on the right side and 0 on the left side.  Psychiatric:         Mood and Affect: Mood and affect normal.         Behavior: Behavior is cooperative.         Cognition and Memory: Memory normal.         Judgment: Judgment normal.          Orders Placed This Encounter    COMPREHENSIVE METABOLIC PNL, FASTING    CBC/DIFF    HGA1C (HEMOGLOBIN A1C WITH EST AVG GLUCOSE)    LIPID PANEL    MICROALBUMIN/CREATININE RATIO, URINE, RANDOM    THYROID STIMULATING HORMONE WITH FREE T4 REFLEX    URINALYSIS, MACROSCOPIC AND MICROSCOPIC W/CULTURE REFLEX    HEPATITIS C ANTIBODY SCREEN WITH REFLEX TO HCV PCR       1. Type 2 diabetes mellitus with hyperglycemia, without long-term current use of insulin (CMS HCC) (Primary)  -     COMPREHENSIVE METABOLIC PNL, FASTING; Future  -  CBC/DIFF; Future  -     HGA1C (HEMOGLOBIN A1C WITH EST AVG GLUCOSE); Future  -     LIPID PANEL; Future  -     MICROALBUMIN/CREATININE RATIO, URINE, RANDOM; Future  -     THYROID STIMULATING HORMONE WITH FREE T4 REFLEX; Future  -     URINALYSIS, MACROSCOPIC AND MICROSCOPIC W/CULTURE REFLEX; Future  IF HER LABS ARE NORMAL WILL D/C GLIPIZIDE DUE TO HYPOGLYCEMIA  ADVISED TO KEEP FASTING GLUCOSE <120 AND 2HR PP <160 DAILY  ENCOURAGED PATIENT TO LOSE WEIGHT BY EATING ONLY FRESH FRUITS, VEGGIES, WHOLE GRAINS, LEAN MEATS, AND FAT FREE DAIRY  ENCOURAGED PATIENT TO EXERCISE AT LEAST 150 MINUTES WEEKLY    2. Mixed hyperlipidemia  -     COMPREHENSIVE METABOLIC PNL, FASTING; Future  -     CBC/DIFF; Future  -     HGA1C (HEMOGLOBIN A1C WITH EST AVG GLUCOSE); Future  -     LIPID PANEL; Future  -     MICROALBUMIN/CREATININE RATIO, URINE, RANDOM; Future  -     THYROID STIMULATING HORMONE WITH FREE T4 REFLEX; Future  -     URINALYSIS, MACROSCOPIC AND MICROSCOPIC W/CULTURE REFLEX; Future  CRESTOR 20MG  QHS    3. Essential hypertension  -     COMPREHENSIVE METABOLIC  PNL, FASTING; Future  -     CBC/DIFF; Future  -     HGA1C (HEMOGLOBIN A1C WITH EST AVG GLUCOSE); Future  -     LIPID PANEL; Future  -     MICROALBUMIN/CREATININE RATIO, URINE, RANDOM; Future  -     THYROID STIMULATING HORMONE WITH FREE T4 REFLEX; Future  -     URINALYSIS, MACROSCOPIC AND MICROSCOPIC W/CULTURE REFLEX; Future  LISINOPRIL 2.5MG  DAILY  MONITOR BP DAILY  IF BP REMAINS >140/90 THEY WILL CALL THE OFFICE  INCREASE WATER INTAKE  DECREASE CAFFEINE INTAKE  DECREASE SALT INTAKE    4. Hypothyroidism (acquired)  -     COMPREHENSIVE METABOLIC PNL, FASTING; Future  -     CBC/DIFF; Future  -     HGA1C (HEMOGLOBIN A1C WITH EST AVG GLUCOSE); Future  -     LIPID PANEL; Future  -     MICROALBUMIN/CREATININE RATIO, URINE, RANDOM; Future  -     THYROID STIMULATING HORMONE WITH FREE T4 REFLEX; Future  -     URINALYSIS, MACROSCOPIC AND MICROSCOPIC W/CULTURE REFLEX; Future  SYNTHROID DAILY    5. Bipolar disorder with depression (CMS HCC)  F/U WITH SPECIALISTS AS SCHEDULED    6. GAD (generalized anxiety disorder)    7. Chronic bilateral low back pain without sciatica  WILL GET MRI LUMBAR SPINE AND RECORDS FROM PAIN MANAGEMENT  PATIENT AWARE I WILL NOT PRESCRIBE CONTROLLED MEDICATIONS INCLUDING BUT NOT LIMITED TO HYDROCODONE AND XANAX    8. Encounter for hepatitis C screening test for low risk patient  -     HEPATITIS C ANTIBODY SCREEN WITH REFLEX TO HCV PCR; Future    9. History of suicide attempt        ENCOUNTER DIAGNOSES     ICD-10-CM   1. Type 2 diabetes mellitus with hyperglycemia, without long-term current use of insulin (CMS HCC)  E11.65   2. Mixed hyperlipidemia  E78.2   3. Essential hypertension  I10   4. Hypothyroidism (acquired)  E03.9   5. Bipolar disorder with depression (CMS HCC)  F31.9   6. GAD (generalized anxiety disorder)  F41.1   7. Chronic bilateral low back pain without sciatica  M54.50    G89.29   8. Encounter for hepatitis C screening test for low risk patient  Z11.59   9. History of suicide  attempt  Z91.51       No follow-ups on file.      Shawna Clamp, PA-C

## 2023-01-21 ENCOUNTER — Telehealth (HOSPITAL_BASED_OUTPATIENT_CLINIC_OR_DEPARTMENT_OTHER): Payer: Self-pay | Admitting: Medical

## 2023-01-21 NOTE — Telephone Encounter (Signed)
PLEASE SEE SCANNED MEDIA.    Sharyn Blitz, MA  01/21/2023 09:22

## 2023-01-22 ENCOUNTER — Other Ambulatory Visit (HOSPITAL_BASED_OUTPATIENT_CLINIC_OR_DEPARTMENT_OTHER): Payer: Self-pay | Admitting: Medical

## 2023-01-22 ENCOUNTER — Telehealth (HOSPITAL_BASED_OUTPATIENT_CLINIC_OR_DEPARTMENT_OTHER): Payer: Self-pay | Admitting: Medical

## 2023-01-22 DIAGNOSIS — D649 Anemia, unspecified: Secondary | ICD-10-CM

## 2023-01-22 DIAGNOSIS — R7989 Other specified abnormal findings of blood chemistry: Secondary | ICD-10-CM

## 2023-01-22 DIAGNOSIS — N182 Chronic kidney disease, stage 2 (mild): Secondary | ICD-10-CM

## 2023-01-22 DIAGNOSIS — R748 Abnormal levels of other serum enzymes: Secondary | ICD-10-CM

## 2023-01-22 LAB — URINE CULTURE,ROUTINE: URINE CULTURE: NO GROWTH

## 2023-01-22 MED ORDER — MELOXICAM 7.5 MG TABLET
7.5000 mg | ORAL_TABLET | Freq: Every day | ORAL | 2 refills | Status: AC
Start: 2023-01-22 — End: ?

## 2023-01-22 NOTE — Telephone Encounter (Signed)
Called patient and went over lab results with her. Patient stated that she is not taking any extra iron or B 12. She said the only thing that she takes extra of is Vit D. Patient stated that she is not having any abnormal bleeding or bruising. Patient is wanting to know why her Ferritin and B 12 are high. PA.    Sharyn Blitz, MA  01/22/2023 09:02

## 2023-01-22 NOTE — Telephone Encounter (Signed)
Called patient and went over what Tresa Endo had said. Patient verbalized good understanding.    Sharyn Blitz, MA  01/22/2023 09:37

## 2023-01-22 NOTE — Telephone Encounter (Signed)
B12 CAN BE FROM DRINKS OR FOOD THAT She IS EATING. ELEVATED FERRITIN CAN BE DUE TO INFLAMMATION AND/OR METABOLIC DISEASE. WILL REPEAT THESE IN A MONTH WITH THE BMP.

## 2023-01-22 NOTE — Telephone Encounter (Signed)
-----   Message from Shawna Clamp, PA-C sent at 01/22/2023  8:00 AM EDT -----  PA  ELEVATED FERRITIN, IS She TAKING ANY EXTRA IRON? ELEVATED B12, IS She TAKING EXTRA B12? A1C GOOD AT 6.3%. WILL CONTINUE TO CONTROL WITH DIET AND MONITOR. LOW ALBUMIN, INCREASE PROTEIN IN THE DIET. LOW HEMOGLOBIN AND IRON, ANY ABNORMAL BLEEDING/BRUISING? WILL START ON LOW DOSE MOBIC FOR PAIN. BMP IN ONE MONTH AFTER STARTING.

## 2023-01-25 ENCOUNTER — Other Ambulatory Visit: Payer: Self-pay

## 2023-01-28 ENCOUNTER — Telehealth (HOSPITAL_BASED_OUTPATIENT_CLINIC_OR_DEPARTMENT_OTHER): Payer: Self-pay | Admitting: Medical

## 2023-01-28 ENCOUNTER — Encounter (INDEPENDENT_AMBULATORY_CARE_PROVIDER_SITE_OTHER): Payer: Self-pay | Admitting: PSYCHIATRY

## 2023-01-28 ENCOUNTER — Ambulatory Visit (INDEPENDENT_AMBULATORY_CARE_PROVIDER_SITE_OTHER): Payer: Medicare PPO | Admitting: PSYCHIATRY

## 2023-01-28 ENCOUNTER — Other Ambulatory Visit: Payer: Self-pay

## 2023-01-28 VITALS — BP 132/86 | HR 98 | Resp 16 | Ht 60.0 in | Wt 214.0 lb

## 2023-01-28 DIAGNOSIS — R918 Other nonspecific abnormal finding of lung field: Secondary | ICD-10-CM

## 2023-01-28 DIAGNOSIS — F339 Major depressive disorder, recurrent, unspecified: Secondary | ICD-10-CM

## 2023-01-28 DIAGNOSIS — F32A Depression, unspecified: Secondary | ICD-10-CM

## 2023-01-28 DIAGNOSIS — Z87891 Personal history of nicotine dependence: Secondary | ICD-10-CM

## 2023-01-28 DIAGNOSIS — J449 Chronic obstructive pulmonary disease, unspecified: Secondary | ICD-10-CM

## 2023-01-28 DIAGNOSIS — F411 Generalized anxiety disorder: Secondary | ICD-10-CM

## 2023-01-28 MED ORDER — ALPRAZOLAM 0.5 MG TABLET
0.5000 mg | ORAL_TABLET | Freq: Three times a day (TID) | ORAL | 0 refills | Status: AC | PRN
Start: 2023-01-28 — End: ?

## 2023-01-28 MED ORDER — TRAZODONE 50 MG TABLET
50.0000 mg | ORAL_TABLET | Freq: Every evening | ORAL | 0 refills | Status: AC
Start: 2023-01-28 — End: ?

## 2023-01-28 MED ORDER — FLUOXETINE 20 MG CAPSULE
20.0000 mg | ORAL_CAPSULE | Freq: Every day | ORAL | 0 refills | Status: AC
Start: 2023-01-28 — End: ?

## 2023-01-28 NOTE — Patient Instructions (Signed)
Follow-up in 4 weeks

## 2023-01-28 NOTE — Telephone Encounter (Signed)
Called and notified patient. Patient verbalized good understanding.    Sharyn Blitz, MA  01/28/2023 10:23

## 2023-01-28 NOTE — Telephone Encounter (Signed)
PLEASE SEE SCANNED MEDIA.    Sharyn Blitz, MA  01/28/2023 09:57

## 2023-01-28 NOTE — Progress Notes (Addendum)
[facility] MEDICINE [facility] PEDIATRICS  BEHAVIORAL MEDICINE, [name] PEDIATRICS  [address]  [place] [address]  [phone]  Progress Note    Kristin, Lawson, 72 y.o. female  Date of Service:  01/28/2023   Date of Birth:  12-May-1951  PCP: Shawna Clamp, PA-C    Chief Complaint: " I have hard time sleeping and I am very nervous"      HPI:  [name] is a 72 y.o. White female who complains of having trouble sleeping.  Patient is feeling down, has a little interest in doing things feeling tired and feeling bad about herself sometimes.  Patient has been having difficulty with her son who is alcoholic and he has not responsible and he projecting his problem with mother and making degrading comments about his mother.  Patient and husband decide about taking further steps about the son and to leave the house if he continue to drink..  Patient has has been compliant with medications and patient is on mirtazapine which does not help with her sleep.    Past Medical History:   Diagnosis Date    Bipolar disorder (CMS HCC)     COPD (chronic obstructive pulmonary disease) (CMS HCC)     Diabetes mellitus, type 2 (CMS HCC)     DJD (degenerative joint disease), lumbar     Full dentures     Generalized anxiety disorder     HLD (hyperlipidemia)     HTN (hypertension)     Hypothyroidism       Past Surgical History:   Procedure Laterality Date    HX BACK SURGERY  2012    LUMBAR SPINE    HX CESAREAN SECTION      x3    HX CHOLECYSTECTOMY      HX HYSTERECTOMY        Current Outpatient Medications   Medication Sig    ALPRAZolam (XANAX) 0.5 mg Oral Tablet Take 1 Tablet (0.5 mg total) by mouth Three times a day as needed for Insomnia or Anxiety    aspirin (ECOTRIN) 81 mg Oral Tablet, Delayed Release (E.C.) Take 1 Tablet (81 mg total) by mouth Once a day    calcium carbonate/vitamin D3 (CALTRATE WITH VITAMIN D3 ORAL) Take by mouth    FLUoxetine (PROZAC) 40 mg Oral Capsule Take 1 Capsule (40 mg total) by mouth Once a  day for 30 days    glipiZIDE 2.5 mg Oral Tablet Take 1 Tablet (2.5 mg total) by mouth Twice a day before meals Take 30 minutes before meals    latanoprost (XALATAN) 0.005 % Ophthalmic Drops Instill 1 Drop into both eyes Every evening    levothyroxine (SYNTHROID) 25 mcg Oral Tablet Take 1 Tablet (25 mcg total) by mouth Every morning    lisinopriL (PRINIVIL) 2.5 mg Oral Tablet Take 1 Tablet (2.5 mg total) by mouth Once a day    meloxicam (MOBIC) 7.5 mg Oral Tablet Take 1 Tablet (7.5 mg total) by mouth Once a day    mirtazapine (REMERON) 7.5 mg Oral Tablet Take 1 Tablet (7.5 mg total) by mouth Every night for 30 days    multivitamin with folic acid (THERA) 400 mcg Oral Tablet Take 1 Tablet by mouth Once a day    rosuvastatin (CRESTOR) 20 mg Oral Tablet Take 1 Tablet (20 mg total) by mouth Every evening       Allergies   Allergen Reactions    Morphine Hives/ Urticaria    Oxycodone Hives/ Urticaria  Social History     Tobacco Use    Smoking status: Former     Average packs/day: 1.5 packs/day for 30.7 years (46.0 ttl pk-yrs)     Types: Cigarettes     Start date: 1993     Passive exposure: Current    Smokeless tobacco: Never   Substance Use Topics    Alcohol use: Not Currently    Drug use: Never       Family Medical History:       Problem Relation (Age of Onset)    Alcohol abuse Sister    Heart Disease Maternal Grandmother, Maternal Grandfather    Uterine Cancer Mother             Other Questionnaires:  PHQ Questionnaire  Little interest or pleasure in doing things.: Several Days  Feeling down, depressed, or hopeless: Several Days  PHQ 2 Total: 2  Trouble falling or staying asleep, or sleeping too much.: More than half the days  Feeling tired or having little energy: Several Days  Poor appetite or overeating: Several Days  Feeling bad about yourself/ that you are a failure in the past 2 weeks?: Several Days  Trouble concentrating on things in the past 2 weeks?: Not at all  Moving/Speaking slowly or being fidgety or  restless  in the past 2 weeks?: Not at all  Thoughts that you would be better off DEAD, or of hurting yourself in some way.: Not at all  If you checked off any problems, how difficult have these problems made it for you to do your work, take care of things at home, or get along with other people?: Somewhat difficult  PHQ 9 Total: 7  Interpretation of Total Score: 5-9 Mild depression      Review of Systems:   Review of Systems   Constitutional: Negative.    HENT: Negative.     Eyes: Negative.    Respiratory: Negative.     Cardiovascular: Negative.    Gastrointestinal: Negative.    Genitourinary: Negative.    Musculoskeletal: Negative.    Skin: Negative.    Neurological: Negative.    Endo/Heme/Allergies: Negative.    Psychiatric/Behavioral:  The patient is nervous/anxious and has insomnia.          Mental Status Exam:  BP 132/86   Pulse 98   Resp 16   Ht 1.524 m (5')   Wt 97.1 kg (214 lb)   SpO2 97%   BMI 41.79 kg/m         Appearance:  casually dressed, overweight, appears actual age, and appears distressed.    Eye Contact:  good.    Behavior:  cooperative.    Motor:  no psychomotor retardation or agitation.   Speech:  normal rate and volume.  Thought Process:  goal oriented.    Description of Associations:  intact  Thought Content:  no paranoia or delusions.    Perception:  no hallucinations endorsed  Suicidal Ideation:  none.    Homicidal Ideation:  none  Judgement:  good.    Insight:  good.    Orientation: Fully oriented to person, place, time and situation.  .    Memory:  Intact short-term and long-term  Attention:  good.    Mood:  depressed.    Affect:  appears anxious.    Cognition:  good abstract ability.    Goals/wishes:   No longer feeling anxious and has better quality of sleep .    Other objective findings:  Improve self-esteem  Medical Decision Making:  Medical Records/Labs/Diagnostic Tests Reviewed:  Records are reviewed    Diagnoses:      ICD-10-CM    1. Depression, unspecified depression  type  F32.A FLUoxetine (PROZAC) 20 mg Oral Capsule      2. GAD (generalized anxiety disorder)  F41.1 FLUoxetine (PROZAC) 20 mg Oral Capsule     ALPRAZolam (XANAX) 0.5 mg Oral Tablet      3. Recurrent major depressive disorder, remission status unspecified (CMS HCC)  F33.9 FLUoxetine (PROZAC) 20 mg Oral Capsule     traZODone (DESYREL) 50 mg Oral Tablet            Problem/Condition:   Problem/Condition:  established   Status:  improving   Comorbidities:  stable    Treatment Plan:  PSYCHOTHERAPY:  Support and reassurance are provided                          PHARMACOTHERAPY:  Discontinue Remeron 7.5.  Started trazodone 50 mg q.h.s.. Decrease Prozac to 20 mg daily.  Continue alprazolam 0.5 mg t.i.d.    Medication Informed Consent: Patient was given information about the medications. These medications were discussed with the patient, including :  The diagnosis and target symptoms for the medication recommended  The possible benefits/intended outcome of treatment.  The possible risks and side effects.  When prescribed a benzodiazepine, sedative, or stimulant the added risk of misuse and/or dependency.  The possible alternatives.  The possible results of not taking the recommended medications.  The dosage and the possibility that my recommended medication dose may need to be adjusted over time, in consultation with my medical practitioner.  My right to withdraw voluntary consent for medication at any time (unless  the use of medications in my treatment are required in a Court Order).          Greater than 50% of time spent in counseling/coordination of care.  I spent twenty minutes out of 30 minutes counseling her regarding:  Sleep hygiene, medication Education and supportive psychotherapy      Follow Up:  Follow-up in 4 weeks      Denny Levy, MD     This note was partially created using voice recognition software and is inherently subject to errors including those of syntax and "sound-alike " substitutions which may  escape proofreading.  In such instances, original meaning may be extrapolated by contextual derivation.

## 2023-01-28 NOTE — Telephone Encounter (Signed)
She NEEDS A REPEAT CT CHEST DUE TO HISTORY OF LUNG NODULES

## 2023-02-02 ENCOUNTER — Telehealth (HOSPITAL_BASED_OUTPATIENT_CLINIC_OR_DEPARTMENT_OTHER): Payer: Self-pay | Admitting: Medical

## 2023-02-02 NOTE — Telephone Encounter (Signed)
02/02/23 pc spoke with [facility] gave appt date and time for CT CHEST WO IV CONTRAST tue 02/10/23 at 2:30 pm . Britta Mccreedy advise she cannot afford to paid $220 for ct scan and that she will call [facility] to cancel appt [facility]  FYI Shawna Clamp

## 2023-02-05 ENCOUNTER — Other Ambulatory Visit (HOSPITAL_BASED_OUTPATIENT_CLINIC_OR_DEPARTMENT_OTHER): Payer: Self-pay | Admitting: Medical

## 2023-02-05 MED ORDER — LEVOTHYROXINE 25 MCG TABLET
25.0000 ug | ORAL_TABLET | Freq: Every morning | ORAL | 1 refills | Status: AC
Start: 2023-02-05 — End: ?

## 2023-02-05 MED ORDER — LISINOPRIL 2.5 MG TABLET
2.5000 mg | ORAL_TABLET | Freq: Every day | ORAL | 1 refills | Status: AC
Start: 2023-02-05 — End: ?

## 2023-02-05 MED ORDER — ROSUVASTATIN 20 MG TABLET
20.0000 mg | ORAL_TABLET | Freq: Every evening | ORAL | 1 refills | Status: AC
Start: 2023-02-05 — End: ?

## 2023-02-10 ENCOUNTER — Other Ambulatory Visit (HOSPITAL_COMMUNITY): Payer: Medicare PPO

## 2023-02-13 ENCOUNTER — Other Ambulatory Visit (HOSPITAL_COMMUNITY): Payer: Medicare PPO

## 2023-02-13 ENCOUNTER — Telehealth (INDEPENDENT_AMBULATORY_CARE_PROVIDER_SITE_OTHER): Payer: Self-pay | Admitting: PSYCHIATRY

## 2023-02-13 NOTE — Telephone Encounter (Signed)
Patient is closing chart. She is moving back to [place].

## 2023-02-13 NOTE — Telephone Encounter (Signed)
Acknowledged.

## 2023-02-25 ENCOUNTER — Ambulatory Visit (HOSPITAL_BASED_OUTPATIENT_CLINIC_OR_DEPARTMENT_OTHER): Payer: Self-pay | Admitting: Medical

## 2023-02-27 ENCOUNTER — Encounter (INDEPENDENT_AMBULATORY_CARE_PROVIDER_SITE_OTHER): Payer: Self-pay | Admitting: PSYCHIATRY

## 2023-03-02 ENCOUNTER — Other Ambulatory Visit (INDEPENDENT_AMBULATORY_CARE_PROVIDER_SITE_OTHER): Payer: Self-pay | Admitting: PSYCHIATRY

## 2023-03-02 DIAGNOSIS — F411 Generalized anxiety disorder: Secondary | ICD-10-CM

## 2023-03-02 NOTE — Telephone Encounter (Signed)
Patient called.  She is trying to get set up with a psychiatrist in [place].  She has to find a PCP first.  She wanted to know if you could send her a refill on her Xanax to the [facility] by her in [place].

## 2023-03-03 MED ORDER — ALPRAZOLAM 0.5 MG TABLET
0.5000 mg | ORAL_TABLET | Freq: Two times a day (BID) | ORAL | 0 refills | Status: AC
Start: 2023-03-03 — End: ?

## 2023-03-03 NOTE — Telephone Encounter (Signed)
I changed the prescription like you asked.

## 2023-03-03 NOTE — Telephone Encounter (Signed)
Send me a prescription of Xanax 0.5 mg twice a day for 1 week and then once a day  for 1 week

## 2023-06-24 ENCOUNTER — Other Ambulatory Visit (HOSPITAL_BASED_OUTPATIENT_CLINIC_OR_DEPARTMENT_OTHER): Payer: Self-pay | Admitting: Medical

## 2023-07-17 ENCOUNTER — Other Ambulatory Visit (HOSPITAL_BASED_OUTPATIENT_CLINIC_OR_DEPARTMENT_OTHER): Payer: Self-pay | Admitting: Medical

## 2023-08-02 ENCOUNTER — Other Ambulatory Visit (HOSPITAL_BASED_OUTPATIENT_CLINIC_OR_DEPARTMENT_OTHER): Payer: Self-pay | Admitting: Medical

## 2023-08-18 IMAGING — MR MRI LUMBAR SPINE WITHOUT CONTRAST
4 series · 48 of 48 positions shown · IV contrast (Off)
Comparison: none

MRI OF THE LUMBAR SPINE WITHOUT CONTRAST
CLINICAL HISTORY: Chronic low back pain.
TECHNIQUE: Multisequential multiplanar imaging was performed of the lumbar spinal region.

[Series 1: z s-c scano · coronal · 6.0mm · 1.17mm/px · 4 of 6 slices shown]
[im 1/6]
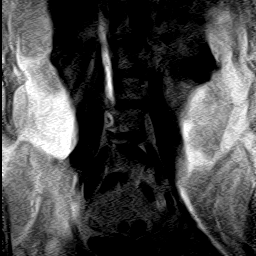
[im 2/6]
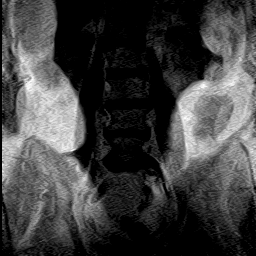
[im 4/6]
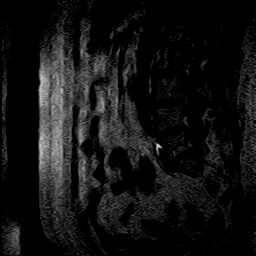
[im 6/6]
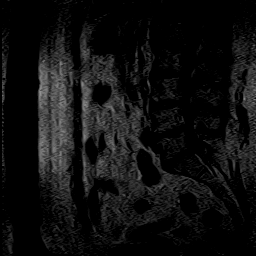

[Series 2: T2 · sagittal · 5.0mm · 1.13mm/px · 11 of 13 slices shown (1 of 2)]
[im 1/13]
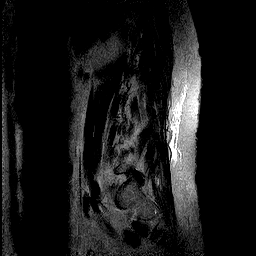
[im 2/13]
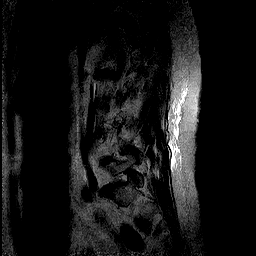
[im 3/13]
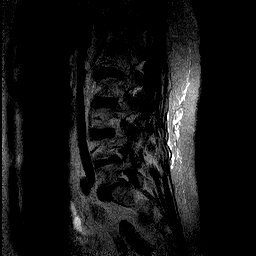
[im 4/13]
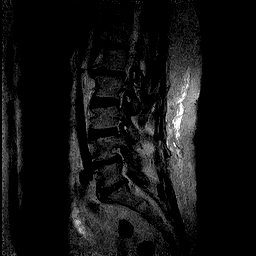
[im 5/13]
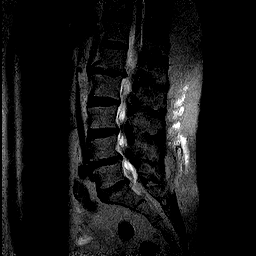
[im 7/13]
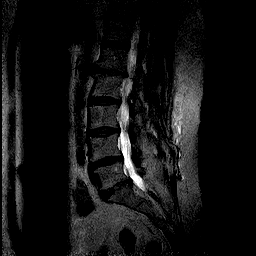
[im 8/13]
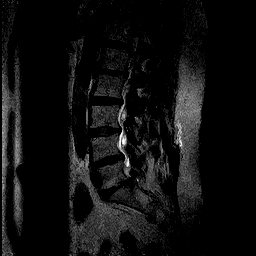
[im 9/13]
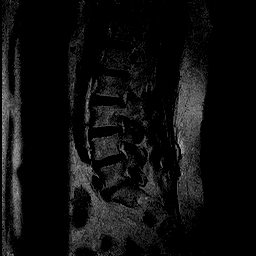
[im 10/13]
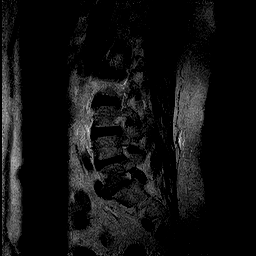
[im 11/13]
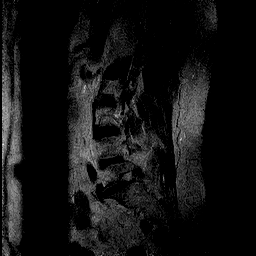
[im 13/13]
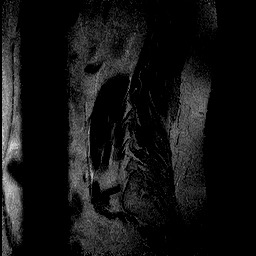

[Series 3: T1 · sagittal · 5.0mm · 1.13mm/px · 11 of 13 slices shown]
[im 1/13]
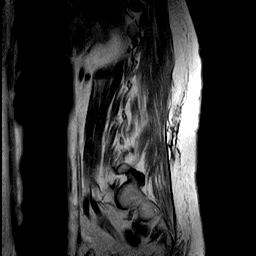
[im 2/13]
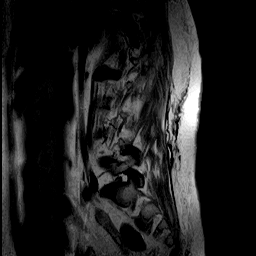
[im 3/13]
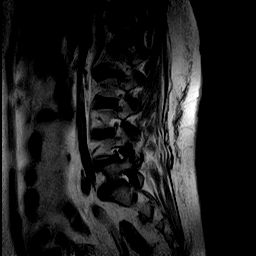
[im 4/13]
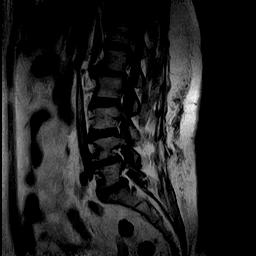
[im 5/13]
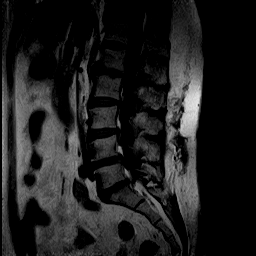
[im 7/13]
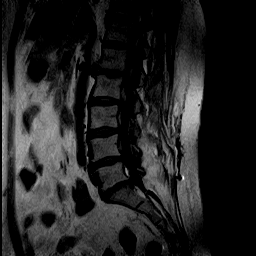
[im 8/13]
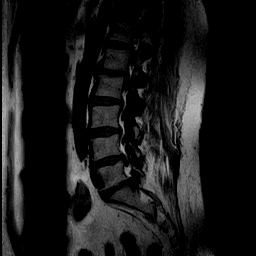
[im 9/13]
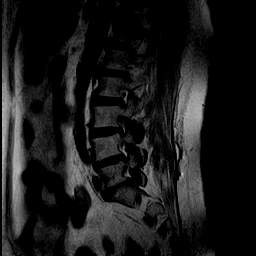
[im 10/13]
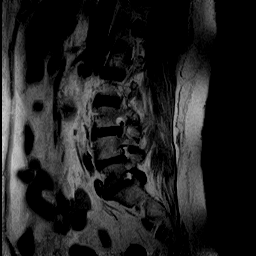
[im 11/13]
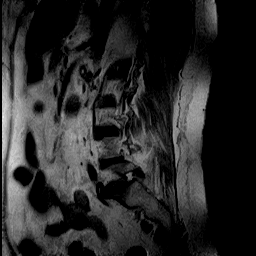
[im 13/13]
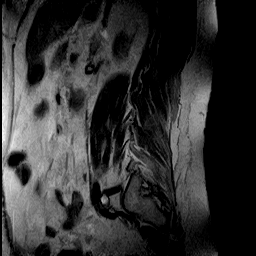

[Series 5: T2 · axial · 4.0mm · 1.05mm/px · z∈[-71,+117]mm · 22 of 27 slices shown (2 of 2)]
[im 1/27]
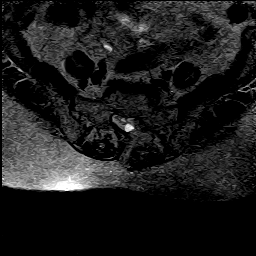
[im 2/27]
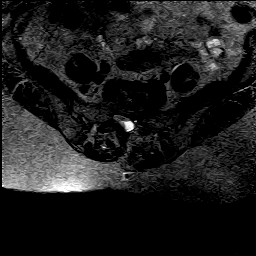
[im 3/27]
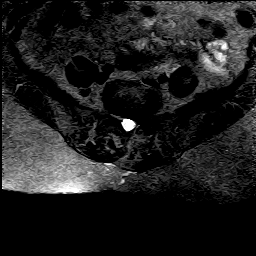
[im 4/27]
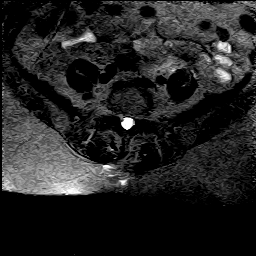
[im 5/27]
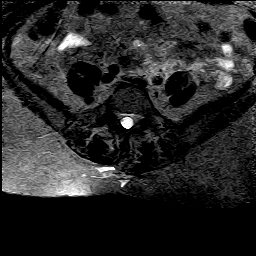
[im 7/27]
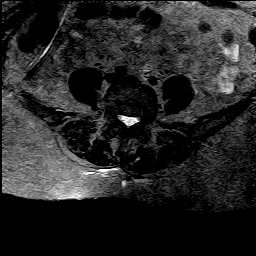
[im 8/27]
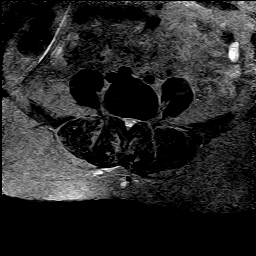
[im 9/27]
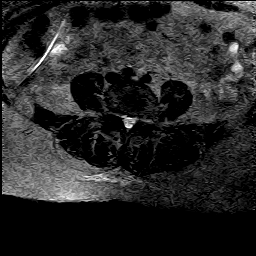
[im 10/27]
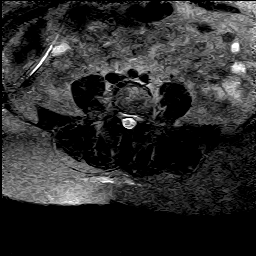
[im 12/27]
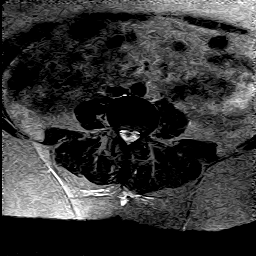
[im 13/27]
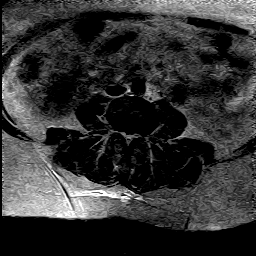
[im 14/27]
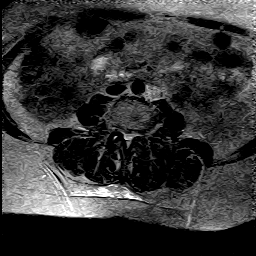
[im 15/27]
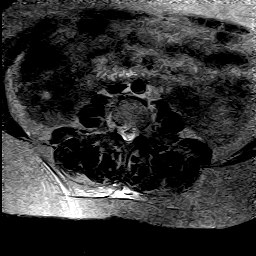
[im 17/27]
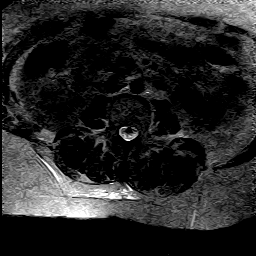
[im 18/27]
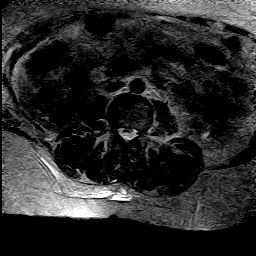
[im 19/27]
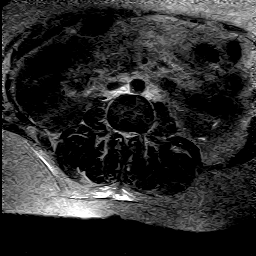
[im 20/27]
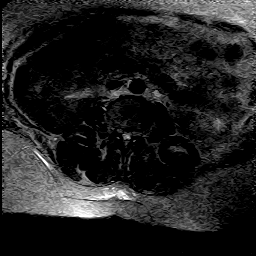
[im 22/27]
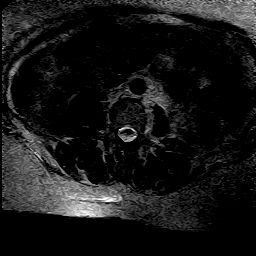
[im 23/27]
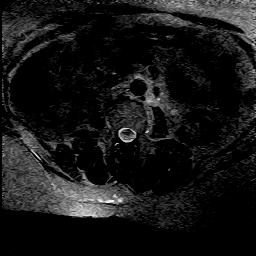
[im 24/27]
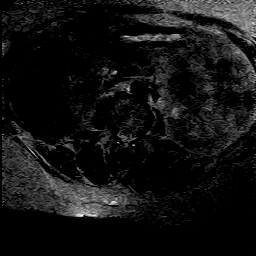
[im 25/27]
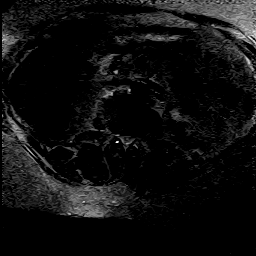
[im 27/27]
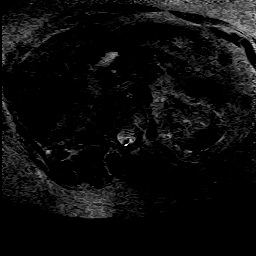

[48 of 48 positions shown; findings below may reference images not displayed]

FINDINGS: There is a normal marrow signal noted throughout the lumbar vertebral bodies. The conus medullaris is unremarkable and there is no obvious intradural abnormality noted. There is severe facet arthrosis throughout the lumbar spinal region. Evidence of previous surgery with metal artifact posteriorly at L4-L5 and L5-S1. There appears to be a laminectomy on the left side at L5-S1. 

At L1-L2 level, there is narrowing, anterior, posterior, and lateral osteophyte, and 2 mm broad-based herniation. Moderate spinal stenosis and severe bilateral neural foraminal narrowing. 

At L2-L3 level, there is posterior and lateral osteophyte, 1 to 1.5 mm retrolisthesis, and 1 to 1.5 mm bulging. Severe spinal stenosis and severe bilateral neural foraminal narrowing. 

At L3-L4 level, there is 1 to 1.5 mm bulging and very severe spinal stenosis. Severe bilateral neural foraminal narrowing. 

At L4-L5 level, there is 1 to 1.5 mm bulging and moderate to severe spinal stenosis. Severe bilateral neural foraminal narrowing. 

At L5-S1 level, there is narrowing, anterior, posterior, and lateral osteophyte, and 1 to 1.5 mm retrolisthesis. Evidence of previous surgery on the left side.
IMPRESSION: 1.
Very severe spinal stenosis at L3-L4 including 1 to 1.5 mm bulging. The anterior-posterior dimension of the spinal canal is 5 mm. This level is likely symptomatic. Severe spinal stenosis at L2-L3 including posterior and lateral osteophyte, 1 to 1.5 mm retrolisthesis, and 1 to 1.5 mm bulging with anterior-posterior dimension of the spinal canal of 7 mm and moderate to severe spinal stenosis at L4-L5 including 1 to 1.5 mm bulging with anterior-posterior dimension of the spinal canal of 8 mm. Moderate spinal stenosis also noted at L1-L2 with narrowing, anterior, posterior, and lateral osteophyte, and 2 mm broad-based herniation. Severe facet arthrosis throughout the lumbar spinal region with ligamentum flavum hypertrophy. 

2.
Narrowing and anterior, posterior, and lateral osteophyte at L5-S1, however, there does appear to be a laminectomy on the left side and evidence of some chronic left-sided epidural scarring. 

3.
Lateral osteophyte and facet arthrosis are causing severe bilateral neural foraminal narrowing at L1-L2, L2-L3, L3-L4, and L4-L5. 

4.
1 to 1.5 mm retrolisthesis of L2-L3 and L5-S1 in neutral position.

## 2024-03-01 ENCOUNTER — Encounter (INDEPENDENT_AMBULATORY_CARE_PROVIDER_SITE_OTHER): Payer: Self-pay
